# Patient Record
Sex: Male | Born: 1970
Health system: Southern US, Community
[De-identification: ages and names within clinical notes are randomized; demographics above are authoritative.]

## PROBLEM LIST (undated history)

## (undated) DIAGNOSIS — Z973 Presence of spectacles and contact lenses: Secondary | ICD-10-CM

## (undated) DIAGNOSIS — Z87442 Personal history of urinary calculi: Secondary | ICD-10-CM

## (undated) DIAGNOSIS — F4001 Agoraphobia with panic disorder: Secondary | ICD-10-CM

## (undated) DIAGNOSIS — I340 Nonrheumatic mitral (valve) insufficiency: Secondary | ICD-10-CM

## (undated) DIAGNOSIS — F32A Depression, unspecified: Secondary | ICD-10-CM

## (undated) DIAGNOSIS — M1990 Unspecified osteoarthritis, unspecified site: Secondary | ICD-10-CM

## (undated) DIAGNOSIS — K449 Diaphragmatic hernia without obstruction or gangrene: Secondary | ICD-10-CM

## (undated) DIAGNOSIS — N200 Calculus of kidney: Secondary | ICD-10-CM

## (undated) DIAGNOSIS — F411 Generalized anxiety disorder: Secondary | ICD-10-CM

## (undated) DIAGNOSIS — F329 Major depressive disorder, single episode, unspecified: Secondary | ICD-10-CM

## (undated) DIAGNOSIS — K219 Gastro-esophageal reflux disease without esophagitis: Secondary | ICD-10-CM

## (undated) DIAGNOSIS — G43909 Migraine, unspecified, not intractable, without status migrainosus: Secondary | ICD-10-CM

## (undated) DIAGNOSIS — G4730 Sleep apnea, unspecified: Secondary | ICD-10-CM

## (undated) DIAGNOSIS — K5792 Diverticulitis of intestine, part unspecified, without perforation or abscess without bleeding: Secondary | ICD-10-CM

## (undated) DIAGNOSIS — I10 Essential (primary) hypertension: Secondary | ICD-10-CM

## (undated) DIAGNOSIS — F419 Anxiety disorder, unspecified: Secondary | ICD-10-CM

## (undated) HISTORY — PX: COLON SURGERY: SHX602

## (undated) HISTORY — DX: Anxiety disorder, unspecified: F41.9

## (undated) HISTORY — PX: NASAL SINUS SURGERY: SHX719

## (undated) HISTORY — DX: Depression, unspecified: F32.A

## (undated) HISTORY — DX: Major depressive disorder, single episode, unspecified: F32.9

---

## 2011-12-19 ENCOUNTER — Ambulatory Visit: Payer: Self-pay | Admitting: Internal Medicine

## 2013-08-06 ENCOUNTER — Ambulatory Visit: Payer: Self-pay | Admitting: Physician Assistant

## 2013-11-01 ENCOUNTER — Ambulatory Visit: Payer: Self-pay | Admitting: Emergency Medicine

## 2014-08-09 HISTORY — PX: CYSTOSCOPY/RETROGRADE/URETEROSCOPY/STONE EXTRACTION WITH BASKET: SHX5317

## 2014-10-01 ENCOUNTER — Ambulatory Visit: Payer: Self-pay | Admitting: Family Medicine

## 2014-10-06 ENCOUNTER — Ambulatory Visit: Payer: Self-pay | Admitting: Family Medicine

## 2015-12-30 DIAGNOSIS — F411 Generalized anxiety disorder: Secondary | ICD-10-CM | POA: Diagnosis not present

## 2015-12-30 DIAGNOSIS — F4001 Agoraphobia with panic disorder: Secondary | ICD-10-CM | POA: Diagnosis not present

## 2016-03-02 DIAGNOSIS — F4001 Agoraphobia with panic disorder: Secondary | ICD-10-CM | POA: Diagnosis not present

## 2016-03-02 DIAGNOSIS — F411 Generalized anxiety disorder: Secondary | ICD-10-CM | POA: Diagnosis not present

## 2016-05-20 ENCOUNTER — Ambulatory Visit
Admission: EM | Admit: 2016-05-20 | Discharge: 2016-05-20 | Disposition: A | Discharge status: Home / Self Care | Payer: PPO | Attending: Emergency Medicine | Admitting: Emergency Medicine

## 2016-05-20 ENCOUNTER — Encounter: Payer: Self-pay | Admitting: Emergency Medicine

## 2016-05-20 DIAGNOSIS — R05 Cough: Secondary | ICD-10-CM

## 2016-05-20 DIAGNOSIS — R059 Cough, unspecified: Secondary | ICD-10-CM

## 2016-05-20 DIAGNOSIS — J0140 Acute pansinusitis, unspecified: Secondary | ICD-10-CM

## 2016-05-20 HISTORY — DX: Gastro-esophageal reflux disease without esophagitis: K21.9

## 2016-05-20 HISTORY — DX: Essential (primary) hypertension: I10

## 2016-05-20 MED ORDER — ALBUTEROL SULFATE HFA 108 (90 BASE) MCG/ACT IN AERS: 1.0000 | INHALATION_SPRAY | Freq: Four times a day (QID) | RESPIRATORY_TRACT | 0 refills | Status: DC | PRN

## 2016-05-20 MED ORDER — HYDROCOD POLST-CPM POLST ER 10-8 MG/5ML PO SUER
5.0000 mL | Freq: Two times a day (BID) | ORAL | 0 refills | Status: DC | PRN
Start: 2016-05-20 — End: 2016-12-14

## 2016-05-20 MED ORDER — DOXYCYCLINE HYCLATE 100 MG PO CAPS: 100.0000 mg | ORAL_CAPSULE | Freq: Two times a day (BID) | ORAL | 0 refills | Status: DC

## 2016-05-20 MED ORDER — MOMETASONE FUROATE 50 MCG/ACT NA SUSP: 2.0000 | Freq: Every day | NASAL | 0 refills | Status: DC

## 2016-05-20 MED ORDER — BENZONATATE 200 MG PO CAPS: 200.0000 mg | ORAL_CAPSULE | Freq: Three times a day (TID) | ORAL | 0 refills | Status: DC | PRN

## 2016-05-20 MED ORDER — AEROCHAMBER PLUS MISC: 2 refills | Status: DC

## 2016-05-20 NOTE — ED Provider Notes (Signed)
HPI  SUBJECTIVE:  Jonathan Aguirre is a 45 y.o. male who presents with 2 complaints. First, he reports a nonproductive cough for the past 4 weeks. He reports chest soreness from the cough. States that overall is getting worse and that he is coughing until he has headaches and it makes him dizzy. Symptoms are worse with activity worse at night and first thing in the morning, no alleviating factors. He has tried Robitussin for this. He denies any preceding URI, wheezing, chest pain, shortness of breath, hemoptysis. No unintentional weight loss. No fevers. No body aches, fatigue. No GERD-type symptoms although the patient has GERD. States that he takes omeprazole for this and it controls his symptoms. No lower extremity edema, orthopnea, nocturia, PND, dyspnea on exertion, unintentional weight gain or abdominal pain.  Second, he reports nasal congestion, rhinorrhea, postnasal drip, right maxillary and frontal sinus pain and pressure for the past 4 days. States his upper teeth hurt. He states that the right side of his face "feels swollen". He had a sore throat, but it has resolved. Some sneezing, but no itchy or watery eyes. No fevers with this. No ear pain or fullness. No purulent nasal drainage, other headache. No antipyretic in past 6-8 hours. He has been taking Sudafed for this. Symptoms are worse with bending forward. No alleviating factors.  He has a past medical history of sinusitis, and states that this feels identical to previous episodes of sinusitis. Also history of GERD, hypertension for which she takes atenolol. He is not on any Ace inhibitors. Also anxiety and depression. No history of CHF, smoking, diabetes, asthma, emphysema, COPD. PMD: [facility]. He has an appointment with them next week.    Past Medical History:  Diagnosis Date  . GERD (gastroesophageal reflux disease)   . Hypertension     Past Surgical History:  Procedure Laterality Date  . COLON SURGERY       History reviewed. No pertinent family history.  Social History  Substance Use Topics  . Smoking status: Never Smoker  . Smokeless tobacco: Never Used  . Alcohol use No    No current facility-administered medications for this encounter.   Current Outpatient Prescriptions:  .  atenolol (TENORMIN) 25 MG tablet, Take by mouth daily., Disp: , Rfl:  .  omeprazole (PRILOSEC) 20 MG capsule, Take 20 mg by mouth 2 (two) times daily before a meal., Disp: , Rfl:  .  traZODone (DESYREL) 100 MG tablet, Take 200 mg by mouth at bedtime., Disp: , Rfl:  .  albuterol (PROVENTIL HFA;VENTOLIN HFA) 108 (90 Base) MCG/ACT inhaler, Inhale 1-2 puffs into the lungs every 6 (six) hours as needed for wheezing or shortness of breath., Disp: 1 Inhaler, Rfl: 0 .  benzonatate (TESSALON) 200 MG capsule, Take 1 capsule (200 mg total) by mouth 3 (three) times daily as needed for cough., Disp: 30 capsule, Rfl: 0 .  chlorpheniramine-HYDROcodone (TUSSIONEX PENNKINETIC ER) 10-8 MG/5ML SUER, Take 5 mLs by mouth every 12 (twelve) hours as needed for cough., Disp: 120 mL, Rfl: 0 .  doxycycline (VIBRAMYCIN) 100 MG capsule, Take 1 capsule (100 mg total) by mouth 2 (two) times daily. X 7 days, Disp: 14 capsule, Rfl: 0 .  mometasone (NASONEX) 50 MCG/ACT nasal spray, Place 2 sprays into the nose daily., Disp: 17 g, Rfl: 0 .  Spacer/Aero-Holding Chambers (AEROCHAMBER PLUS) inhaler, Use as instructed, Disp: 1 each, Rfl: 2  No Known Allergies   ROS  As noted in HPI.   Physical Exam  BP 110/86 (BP Location: Left Arm)   Pulse 81   Temp 97.9 F (36.6 C) (Tympanic)   Resp 16   Ht 6\' 1"  (1.854 m)   Wt 270 lb (122.5 kg)   SpO2 98%   BMI 35.62 kg/m   Constitutional: Well developed, well nourished, no acute distress Eyes:  EOMI, conjunctiva normal bilaterally HENT: Normocephalic, atraumatic,mucus membranes moist. TMs normal. Positive purulent nasal drainage. Positive maxillary sinus tenderness right side. Positive postnasal  drip, oropharynx otherwise unremarkable. Respiratory: Normal inspiratory effort lungs clear bilaterally. Good air movement. Positive chest wall tenderness Cardiovascular: Normal rate regular rhythm no murmurs rubs or gallops GI: nondistended skin: No rash, skin intact Musculoskeletal: no deformities. No lower extremity edema bilaterally. Neurologic: Alert & oriented x 3, no focal neuro deficits Psychiatric: Speech and behavior appropriate   ED Course   Medications - No data to display  No orders of the defined types were placed in this encounter.   No results found for this or any previous visit (from the past 24 hour(s)). No results found.  ED Clinical Impression  Cough  Acute non-recurrent pansinusitis   ED Assessment/Plan  Offered to do an x-ray, however, patient declined.  Has follow-up appointment with his primary care physician at [facility] next week. If he is not getting any better despite this treatment, he will revisit this with his physician and may do an x-ray at that time.  Feel the cough could be from postnasal drip versus silent GERD although patient is already on omeprazole and denies any symptoms of GERD. Doubt pneumonia in the absence of fevers, body aches or fatigue, and focal lung findings, malignancy the absence of unintentional weight loss, could be reactive airways. No evidence of CHF. He is not on any Ace inhibitors. Plan to send home with albuterol with spacer, Nasonex and advised him to start an antihistamine such as Claritin, Allegra, Zyrtec when he is feeling better from his sinuses. Also Tussionex and Tessalon.   Patient with a sinusitis, could be viral, however, he has purulent nasal drainage. We'll send him home on doxycycline 100 mg twice a day for 7 days which would also cover an occult pneumonia, Nasonex, saline nasal irrigation, plain Mucinex given that he has a history of high blood pressure. We'll switch to an antihistamine once his  sinuses clear.  Discussed  MDM, plan and followup with patient. Discussed sn/sx that should prompt return to the ED. Patient  agrees with plan.   Meds ordered this encounter  Medications  . atenolol (TENORMIN) 25 MG tablet    Sig: Take by mouth daily.  Marland Kitchen omeprazole (PRILOSEC) 20 MG capsule    Sig: Take 20 mg by mouth 2 (two) times daily before a meal.  . traZODone (DESYREL) 100 MG tablet    Sig: Take 200 mg by mouth at bedtime.  . benzonatate (TESSALON) 200 MG capsule    Sig: Take 1 capsule (200 mg total) by mouth 3 (three) times daily as needed for cough.    Dispense:  30 capsule    Refill:  0  . chlorpheniramine-HYDROcodone (TUSSIONEX PENNKINETIC ER) 10-8 MG/5ML SUER    Sig: Take 5 mLs by mouth every 12 (twelve) hours as needed for cough.    Dispense:  120 mL    Refill:  0  . Spacer/Aero-Holding Chambers (AEROCHAMBER PLUS) inhaler    Sig: Use as instructed    Dispense:  1 each    Refill:  2  . albuterol (PROVENTIL HFA;VENTOLIN HFA)  108 (90 Base) MCG/ACT inhaler    Sig: Inhale 1-2 puffs into the lungs every 6 (six) hours as needed for wheezing or shortness of breath.    Dispense:  1 Inhaler    Refill:  0  . mometasone (NASONEX) 50 MCG/ACT nasal spray    Sig: Place 2 sprays into the nose daily.    Dispense:  17 g    Refill:  0  . doxycycline (VIBRAMYCIN) 100 MG capsule    Sig: Take 1 capsule (100 mg total) by mouth 2 (two) times daily. X 7 days    Dispense:  14 capsule    Refill:  0    *This clinic note was created using [employer]. Therefore, there may be occasional mistakes despite careful proofreading.  ?   Melynda Ripple, MD 05/20/16 1309

## 2016-05-20 NOTE — Discharge Instructions (Signed)
You may take 800 mg of motrin with 1 gram of tylenol up to 3 times a day as needed for pain. This is an effective combination for pain.  Use a neti pot or the NeilMed sinus rinse as often as you want to to reduce nasal congestion. Follow the directions on the box.   Go to www.goodrx.com to look up your medications. This will give you a list of where you can find your prescriptions at the most affordable prices.

## 2016-05-20 NOTE — ED Triage Notes (Signed)
Patient c/o cough, chest congestion and sinus pressure and pain for 4 weeks.  Patient denies fevers.

## 2016-05-25 DIAGNOSIS — I10 Essential (primary) hypertension: Secondary | ICD-10-CM | POA: Diagnosis not present

## 2016-05-25 DIAGNOSIS — Z0000 Encounter for general adult medical examination without abnormal findings: Secondary | ICD-10-CM | POA: Diagnosis not present

## 2016-05-25 DIAGNOSIS — Z23 Encounter for immunization: Secondary | ICD-10-CM | POA: Diagnosis not present

## 2016-05-25 DIAGNOSIS — K219 Gastro-esophageal reflux disease without esophagitis: Secondary | ICD-10-CM | POA: Diagnosis not present

## 2016-07-20 DIAGNOSIS — F4001 Agoraphobia with panic disorder: Secondary | ICD-10-CM | POA: Diagnosis not present

## 2016-07-20 DIAGNOSIS — F411 Generalized anxiety disorder: Secondary | ICD-10-CM | POA: Diagnosis not present

## 2016-11-16 DIAGNOSIS — F4001 Agoraphobia with panic disorder: Secondary | ICD-10-CM | POA: Diagnosis not present

## 2016-11-16 DIAGNOSIS — F411 Generalized anxiety disorder: Secondary | ICD-10-CM | POA: Diagnosis not present

## 2016-12-03 ENCOUNTER — Ambulatory Visit: Payer: PPO | Admitting: Cardiovascular Disease

## 2016-12-11 NOTE — Progress Notes (Addendum)
Cardiology Office Note  Date:  12/14/2016   ID:  ACELIN FERDIG, DOB 1970-12-14, MRN [id number]  PCP:  [facility]   Chief Complaint  Patient presents with  . other    Self referral for a cardiac evaluation. Meds reviewed by the pt. verbally. Pt. c/o chest pain and shortness of breath, dizziness, nausea and breaking out into a sweat.     HPI:  Jonathan Aguirre is a pleasant 46 year old gentleman with history of Obesity Anxiety/depression HTN Back pain H/A GERD Chronic chest pain Who presents by self-referral for evaluation of his chest pain and shortness of breath  Wife presents with him today Reports he had dramatic weight loss several years ago, 50 pounds Since that time has put some of the weight back on He is a farmer, very active at baseline, different types of animals  Currently takes atenolol 25 mg daily Reports having occasional dizziness when he stands up Sometimes has dizziness from a sitting position Reports recently was driving when he had dizziness while driving had to pull over, symptoms lasted less than 1 minute  Long history of chest pain Previous workup including echocardiogram and stress test Wife feels it is likely from stress  Sometimes with shortness of breath, typically my present with short exertion such as walking in his house. 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  PSH:    Past Surgical History:  Procedure Laterality Date  . COLON SURGERY      Current Outpatient Prescriptions  Medication Sig  Dispense Refill  . atenolol (TENORMIN) 25 MG tablet Take by mouth daily.    Marland Kitchen lamoTRIgine (LAMICTAL) 100 MG tablet Take 100 mg by mouth daily.    Marland Kitchen omeprazole (PRILOSEC) 20 MG capsule Take 20 mg by mouth 2 (two) times daily before a meal.    . traZODone (DESYREL) 100 MG tablet Take 200 mg by mouth at bedtime.     No current facility-administered medications for this visit.      Allergies:   Patient has no known allergies.   Social History:  The patient  reports that he has never smoked. He has never used smokeless tobacco. He reports that he does not drink alcohol or use drugs.   Family History:   family history includes Heart failure in his mother; Hyperlipidemia in his mother; Hypertension in his mother.    Review of Systems: Review of Systems  Constitutional: Negative.        Weight gain  Respiratory: Positive for shortness of breath.   Cardiovascular: Positive for chest pain.  Gastrointestinal: Negative.   Musculoskeletal: Negative.   Neurological: Positive for dizziness.  Psychiatric/Behavioral: The patient is nervous/anxious.   All other systems reviewed and are negative.    PHYSICAL EXAM: VS:  BP 122/90 (BP Location: Right Arm, Patient Position: Sitting, Cuff Size: Large)   Pulse 86   Ht 6\' 1"  (1.854 m)   Wt 279 lb 12 oz (126.9 kg)   BMI 36.91 kg/m  , BMI Body mass index is 36.91 kg/m. GEN: Well nourished, well  developed, in no acute distress , Obese HEENT: normal  Neck: no JVD, carotid bruits, or masses Cardiac: RRR; no murmurs, rubs, or gallops,no edema  Respiratory:  clear to auscultation bilaterally, normal work of breathing GI: soft, nontender, nondistended, + BS MS: no deformity or atrophy  Skin: warm and dry, no rash Neuro:  Strength and sensation are intact Psych: euthymic mood, full affect    Recent Labs: No results found for requested labs within last 8760 hours.    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  If score is elevated, would treat risk factors more aggressively, possibly including additional workup such as stress testing  Hypertension, unspecified type - Plan: EKG 12-Lead, CT CARDIAC SCORING Blood pressure is well controlled on today's visit. No changes made to the medications.  Family history of premature CAD - Plan: CT CARDIAC SCORING Given strong family history, we have recommended CT coronary calcium scoring for risk stratification  Shortness of breath Shortness of breath likely secondary to deconditioning and weight Recommended regular exercise program, weight loss  Morbid obesity ([place]) We have encouraged continued exercise, careful diet management in an effort to lose weight.  Anxiety Long-standing issue, reports symptoms are relatively well-controlled  Dizziness Etiology unclear, unable to exclude orthostasis. Recommended he stay hydrated Unable to exclude arrhythmia and recommended he use a pulse meter on his phone to track his rhythm. If there is concern of arrhythmia, event monitor could be ordered  Disposition:   F/U  as needed   Total encounter time more than 45 minutes  Greater than 50% was spent in counseling and coordination of care with the patient    Orders Placed This Encounter  Procedures  . CT CARDIAC SCORING  . EKG 12-Lead     Signed, Esmond Plants, M.D., Ph.D. 12/14/2016  [facility] [place], [place] [phone]

## 2016-12-14 ENCOUNTER — Ambulatory Visit (INDEPENDENT_AMBULATORY_CARE_PROVIDER_SITE_OTHER): Payer: PPO | Admitting: Cardiovascular Disease

## 2016-12-14 ENCOUNTER — Encounter: Payer: Self-pay | Admitting: Cardiovascular Disease

## 2016-12-14 VITALS — BP 122/90 | HR 86 | Ht 73.0 in | Wt 279.8 lb

## 2016-12-14 DIAGNOSIS — I10 Essential (primary) hypertension: Secondary | ICD-10-CM | POA: Insufficient documentation

## 2016-12-14 DIAGNOSIS — R079 Chest pain, unspecified: Secondary | ICD-10-CM | POA: Diagnosis not present

## 2016-12-14 DIAGNOSIS — F419 Anxiety disorder, unspecified: Secondary | ICD-10-CM

## 2016-12-14 DIAGNOSIS — R42 Dizziness and giddiness: Secondary | ICD-10-CM | POA: Diagnosis not present

## 2016-12-14 DIAGNOSIS — Z8249 Family history of ischemic heart disease and other diseases of the circulatory system: Secondary | ICD-10-CM | POA: Diagnosis not present

## 2016-12-14 DIAGNOSIS — R0602 Shortness of breath: Secondary | ICD-10-CM

## 2016-12-14 NOTE — Patient Instructions (Addendum)
Medication Instructions:   No medication changes made  Labwork:  No new labs needed  Testing/Procedures:  We will order a CT coronary calcium score  Please call [phone] to schedule this at your convenience There is a one-time fee of $150 due at the time of your procedure [address] [place]: as  needed  If you need a refill on your cardiac medications before your next appointment, please call your pharmacy.    Coronary Calcium Scan A coronary calcium scan is an imaging test used to look for deposits of calcium and other fatty materials (plaques) in the inner lining of the blood vessels of the heart (coronary arteries). These deposits of calcium and plaques can partly clog and narrow the coronary arteries without producing any symptoms or warning signs. This puts a person at risk for a heart attack. This test can detect these deposits before symptoms develop. Tell a health care provider about:  Any allergies you have.  All medicines you are taking, including vitamins, herbs, eye drops, creams, and over-the-counter medicines.  Any problems you or family members have had with anesthetic medicines.  Any blood disorders you have.  Any surgeries you have had.  Any medical conditions you have.  Whether you are pregnant or may be pregnant. What are the risks? Generally, this is a safe procedure. However, problems may occur, including:  Harm to a pregnant woman and her unborn baby. This test involves the use of radiation. Radiation exposure can be dangerous to a pregnant woman and her unborn baby. If you are pregnant, you generally should not have this procedure done.  Slight increase in the risk of cancer. This is because of the radiation involved in the test. What happens before the procedure? No preparation is needed for this procedure. What happens during the procedure?  You will undress and remove any jewelry around your neck or  chest.  You will put on a hospital gown.  Sticky electrodes will be placed on your chest. The electrodes will be connected to an electrocardiogram (ECG) machine to record a tracing of the electrical activity of your heart.  A CT scanner will take pictures of your heart. During this time, you will be asked to lie still and hold your breath for 2-3 seconds while a picture of your heart is being taken. The procedure may vary among health care providers and hospitals. What happens after the procedure?  You can get dressed.  You can return to your normal activities.  It is up to you to get the results of your test. Ask your health care provider, or the department that is doing the test, when your results will be ready. Summary  A coronary calcium scan is an imaging test used to look for deposits of calcium and other fatty materials (plaques) in the inner lining of the blood vessels of the heart (coronary arteries).  Generally, this is a safe procedure. Tell your health care provider if you are pregnant or may be pregnant.  No preparation is needed for this procedure.  A CT scanner will take pictures of your heart.  You can return to your normal activities after the scan is done. This information is not intended to replace advice given to you by your health care provider. Make sure you discuss any questions you have with your health care provider. Document Released: 01/22/2008 Document Revised: 06/14/2016 Document Reviewed: 06/14/2016 Elsevier Interactive Patient Education  2017 [employer].

## 2017-01-26 ENCOUNTER — Ambulatory Visit (INDEPENDENT_AMBULATORY_CARE_PROVIDER_SITE_OTHER)
Admission: RE | Admit: 2017-01-26 | Discharge: 2017-01-26 | Disposition: A | Discharge status: Home / Self Care | Payer: Self-pay | Source: Ambulatory Visit | Attending: Cardiovascular Disease | Admitting: Cardiovascular Disease

## 2017-01-26 DIAGNOSIS — I10 Essential (primary) hypertension: Secondary | ICD-10-CM

## 2017-01-26 DIAGNOSIS — R079 Chest pain, unspecified: Secondary | ICD-10-CM

## 2017-01-26 DIAGNOSIS — Z8249 Family history of ischemic heart disease and other diseases of the circulatory system: Secondary | ICD-10-CM

## 2017-01-31 ENCOUNTER — Other Ambulatory Visit: Payer: Self-pay | Admitting: *Deleted

## 2017-01-31 DIAGNOSIS — R0602 Shortness of breath: Secondary | ICD-10-CM

## 2017-01-31 MED ORDER — ROSUVASTATIN CALCIUM 5 MG PO TABS: 5.0000 mg | ORAL_TABLET | Freq: Every day | ORAL | 3 refills | Status: DC

## 2017-02-07 ENCOUNTER — Other Ambulatory Visit: Payer: Self-pay

## 2017-02-07 DIAGNOSIS — I10 Essential (primary) hypertension: Secondary | ICD-10-CM

## 2017-02-07 DIAGNOSIS — Z8249 Family history of ischemic heart disease and other diseases of the circulatory system: Secondary | ICD-10-CM

## 2017-02-07 DIAGNOSIS — R0602 Shortness of breath: Secondary | ICD-10-CM

## 2017-02-07 DIAGNOSIS — R079 Chest pain, unspecified: Secondary | ICD-10-CM

## 2017-03-01 ENCOUNTER — Other Ambulatory Visit (INDEPENDENT_AMBULATORY_CARE_PROVIDER_SITE_OTHER): Payer: PPO | Admitting: *Deleted

## 2017-03-01 ENCOUNTER — Other Ambulatory Visit: Payer: Self-pay

## 2017-03-01 ENCOUNTER — Ambulatory Visit (INDEPENDENT_AMBULATORY_CARE_PROVIDER_SITE_OTHER): Payer: PPO

## 2017-03-01 DIAGNOSIS — R0602 Shortness of breath: Secondary | ICD-10-CM

## 2017-03-01 DIAGNOSIS — Z8249 Family history of ischemic heart disease and other diseases of the circulatory system: Secondary | ICD-10-CM | POA: Diagnosis not present

## 2017-03-01 DIAGNOSIS — R079 Chest pain, unspecified: Secondary | ICD-10-CM

## 2017-03-01 DIAGNOSIS — I10 Essential (primary) hypertension: Secondary | ICD-10-CM

## 2017-03-02 LAB — LIPID PANEL
Chol/HDL Ratio: 3.7 ratio (ref 0.0–5.0)
Cholesterol, Total: 138 mg/dL (ref 100–199)
HDL: 37 mg/dL — AB (ref >39)
LDL Calculated: 77 mg/dL (ref 0–99)
Triglycerides: 121 mg/dL (ref 0–149)
VLDL CHOLESTEROL CAL: 24 mg/dL (ref 5–40)

## 2017-03-02 LAB — HEPATIC FUNCTION PANEL
ALT: 39 IU/L (ref 0–44)
AST: 30 IU/L (ref 0–40)
Albumin: 4.5 g/dL (ref 3.5–5.5)
Alkaline Phosphatase: 50 IU/L (ref 39–117)
Bilirubin Total: 0.6 mg/dL (ref 0.0–1.2)
Bilirubin, Direct: 0.16 mg/dL (ref 0.00–0.40)
Total Protein: 6.7 g/dL (ref 6.0–8.5)

## 2017-03-02 LAB — HEMOGLOBIN A1C
Est. average glucose Bld gHb Est-mCnc: 100 mg/dL
Hgb A1c MFr Bld: 5.1 % (ref 4.8–5.6)

## 2017-03-29 DIAGNOSIS — L918 Other hypertrophic disorders of the skin: Secondary | ICD-10-CM | POA: Diagnosis not present

## 2017-03-29 DIAGNOSIS — L821 Other seborrheic keratosis: Secondary | ICD-10-CM | POA: Diagnosis not present

## 2017-03-29 DIAGNOSIS — D229 Melanocytic nevi, unspecified: Secondary | ICD-10-CM | POA: Diagnosis not present

## 2017-05-24 ENCOUNTER — Emergency Department
Admission: EM | Admit: 2017-05-24 | Discharge: 2017-05-24 | Disposition: A | Discharge status: Home / Self Care | Payer: PPO | Attending: Emergency Medicine | Admitting: Emergency Medicine

## 2017-05-24 ENCOUNTER — Encounter: Payer: Self-pay | Admitting: Emergency Medicine

## 2017-05-24 ENCOUNTER — Emergency Department: Payer: PPO

## 2017-05-24 DIAGNOSIS — Z79899 Other long term (current) drug therapy: Secondary | ICD-10-CM | POA: Diagnosis not present

## 2017-05-24 DIAGNOSIS — N451 Epididymitis: Secondary | ICD-10-CM | POA: Diagnosis not present

## 2017-05-24 DIAGNOSIS — I10 Essential (primary) hypertension: Secondary | ICD-10-CM | POA: Diagnosis not present

## 2017-05-24 DIAGNOSIS — I861 Scrotal varices: Secondary | ICD-10-CM | POA: Insufficient documentation

## 2017-05-24 DIAGNOSIS — N5089 Other specified disorders of the male genital organs: Secondary | ICD-10-CM

## 2017-05-24 DIAGNOSIS — N509 Disorder of male genital organs, unspecified: Secondary | ICD-10-CM | POA: Diagnosis not present

## 2017-05-24 DIAGNOSIS — N50811 Right testicular pain: Secondary | ICD-10-CM | POA: Diagnosis present

## 2017-05-24 LAB — COMPREHENSIVE METABOLIC PANEL
ALT: 25 U/L (ref 17–63)
ANION GAP: 8 (ref 5–15)
AST: 23 U/L (ref 15–41)
Albumin: 4.4 g/dL (ref 3.5–5.0)
Alkaline Phosphatase: 45 U/L (ref 38–126)
BUN: 14 mg/dL (ref 6–20)
CHLORIDE: 108 mmol/L (ref 101–111)
CO2: 22 mmol/L (ref 22–32)
Calcium: 9.2 mg/dL (ref 8.9–10.3)
Creatinine, Ser: 0.92 mg/dL (ref 0.61–1.24)
GFR calc non Af Amer: >60 mL/min (ref >60)
Glucose, Bld: 104 mg/dL — ABNORMAL HIGH (ref 65–99)
Potassium: 4.2 mmol/L (ref 3.5–5.1)
SODIUM: 138 mmol/L (ref 135–145)
Total Bilirubin: 1 mg/dL (ref 0.3–1.2)
Total Protein: 7.1 g/dL (ref 6.5–8.1)

## 2017-05-24 LAB — URINALYSIS, COMPLETE (UACMP) WITH MICROSCOPIC
BILIRUBIN URINE: NEGATIVE
Bacteria, UA: NONE SEEN
GLUCOSE, UA: NEGATIVE mg/dL
Hgb urine dipstick: NEGATIVE
KETONES UR: NEGATIVE mg/dL
LEUKOCYTES UA: NEGATIVE
Nitrite: NEGATIVE
PH: 6 (ref 5.0–8.0)
Protein, ur: NEGATIVE mg/dL
RBC / HPF: NONE SEEN RBC/hpf (ref 0–5)
Specific Gravity, Urine: 1.025 (ref 1.005–1.030)

## 2017-05-24 LAB — CBC
HCT: 45.8 % (ref 40.0–52.0)
HEMOGLOBIN: 15.5 g/dL (ref 13.0–18.0)
MCH: 29.7 pg (ref 26.0–34.0)
MCHC: 33.9 g/dL (ref 32.0–36.0)
MCV: 87.5 fL (ref 80.0–100.0)
Platelets: 246 10*3/uL (ref 150–440)
RBC: 5.23 MIL/uL (ref 4.40–5.90)
RDW: 13.4 % (ref 11.5–14.5)
WBC: 11.3 10*3/uL — ABNORMAL HIGH (ref 3.8–10.6)

## 2017-05-24 LAB — CHLAMYDIA/NGC RT PCR (ARMC ONLY)
Chlamydia Tr: NOT DETECTED
N gonorrhoeae: NOT DETECTED

## 2017-05-24 LAB — LIPASE, BLOOD: LIPASE: 32 U/L (ref 11–51)

## 2017-05-24 MED ORDER — SODIUM CHLORIDE 0.9 % IV BOLUS (SEPSIS)
1000.0000 mL | Freq: Once | INTRAVENOUS | Status: AC
  Administered 2017-05-24: 1000 mL via INTRAVENOUS

## 2017-05-24 MED ORDER — KETOROLAC TROMETHAMINE 30 MG/ML IJ SOLN
15.0000 mg | Freq: Once | INTRAMUSCULAR | Status: AC
  Administered 2017-05-24: 15 mg via INTRAVENOUS
  Filled 2017-05-24: qty 1

## 2017-05-24 MED ORDER — MORPHINE SULFATE (PF) 4 MG/ML IV SOLN
4.0000 mg | Freq: Once | INTRAVENOUS | Status: AC
  Administered 2017-05-24: 4 mg via INTRAVENOUS
  Filled 2017-05-24: qty 1

## 2017-05-24 MED ORDER — IBUPROFEN 600 MG PO TABS: 600.0000 mg | ORAL_TABLET | Freq: Four times a day (QID) | ORAL | 0 refills | Status: DC | PRN

## 2017-05-24 MED ORDER — ONDANSETRON HCL 4 MG/2ML IJ SOLN
4.0000 mg | Freq: Once | INTRAMUSCULAR | Status: AC
  Administered 2017-05-24: 4 mg via INTRAVENOUS
  Filled 2017-05-24: qty 2

## 2017-05-24 MED ORDER — OXYCODONE-ACETAMINOPHEN 5-325 MG PO TABS: 1.0000 | ORAL_TABLET | Freq: Four times a day (QID) | ORAL | 0 refills | Status: DC | PRN

## 2017-05-24 MED ORDER — SULFAMETHOXAZOLE-TRIMETHOPRIM 800-160 MG PO TABS: 1.0000 | ORAL_TABLET | Freq: Two times a day (BID) | ORAL | 0 refills | Status: AC

## 2017-05-24 MED ORDER — CEFTRIAXONE SODIUM IN DEXTROSE 20 MG/ML IV SOLN
1.0000 g | Freq: Once | INTRAVENOUS | Status: AC
  Administered 2017-05-24: 1 g via INTRAVENOUS

## 2017-05-24 MED ORDER — SULFAMETHOXAZOLE-TRIMETHOPRIM 800-160 MG PO TABS: 1.0000 | ORAL_TABLET | Freq: Once | ORAL | Status: DC

## 2017-05-24 NOTE — ED Triage Notes (Signed)
Patient ambulatory to triage with steady gait, without difficulty, appears uncomfortable; reports right testicular pain/swelling since yesterday; denies hx of same

## 2017-05-24 NOTE — Discharge Instructions (Signed)
return to the emergency room if your pain gets worse as this could be due to torsion which is a medical emergency. Otherwise follow up with urology in 2-3 days. Take the antibiotics as prescribed. Take ibuprofen 3 times a day with meals or snack and take Percocet as needed for worsening pain.

## 2017-05-24 NOTE — ED Provider Notes (Signed)
[facility] Emergency Department Provider Note  ____________________________________________  Time seen: Approximately 7:49 AM  I have reviewed the triage vital signs and the nursing notes.   HISTORY  Chief Complaint Testicle Pain   HPI Jonathan Aguirre is a 46 y.o. male with no significant past medical history who presents for evaluation of right testicular pain. Patient reports mild discomfort yesterday evening. This morning at 4 AM he woke up with severe pain in his right testicle that has been constant and nonradiating. Patient denies any penile discharge or prior history of STDs. no abdominal pain, nausea, vomiting, fever, chills, dysuria.  Past Medical History:  Diagnosis Date  . Anxiety and depression   . GERD (gastroesophageal reflux disease)   . Hypertension     Patient Active Problem List   Diagnosis Date Noted  . Shortness of breath 12/14/2016  . Hypertension 12/14/2016  . Chest pain 12/14/2016  . Family history of premature CAD 12/14/2016  . Morbid obesity ([place]) 12/14/2016  . Anxiety 12/14/2016  . Dizziness 12/14/2016    Past Surgical History:  Procedure Laterality Date  . COLON SURGERY      Prior to Admission medications   Medication Sig Start Date End Date Taking? Authorizing Provider  atenolol (TENORMIN) 25 MG tablet Take by mouth daily.   Yes [provider]  diphenhydrAMINE (SOMINEX) 25 MG tablet Take 50 mg by mouth at bedtime.   Yes [provider]  lamoTRIgine (LAMICTAL) 100 MG tablet Take 100 mg by mouth daily.   Yes [provider]  Multiple Vitamin (MULTIVITAMIN) tablet Take 1 tablet by mouth daily.   Yes [provider]  omeprazole (PRILOSEC) 20 MG capsule Take 20 mg by mouth 2 (two) times daily before a meal.   Yes [provider]  traZODone (DESYREL) 100 MG tablet Take 200 mg by mouth at bedtime.   Yes [provider]  cyclobenzaprine (FLEXERIL) 10 MG tablet  Take 10 mg by mouth daily as needed for muscle spasms.    [provider]  ibuprofen (ADVIL,MOTRIN) 600 MG tablet Take 1 tablet (600 mg total) by mouth every 6 (six) hours as needed. 05/24/17   Rudene Re, MD  oxyCODONE-acetaminophen (ROXICET) 5-325 MG tablet Take 1 tablet by mouth every 6 (six) hours as needed. 05/24/17 05/24/18  Rudene Re, MD  rosuvastatin (CRESTOR) 5 MG tablet Take 1 tablet (5 mg total) by mouth daily. 01/31/17 05/01/17  Minna Merritts, MD  sulfamethoxazole-trimethoprim (BACTRIM DS,SEPTRA DS) 800-160 MG tablet Take 1 tablet by mouth 2 (two) times daily. 05/24/17 06/03/17  Rudene Re, MD    Allergies Patient has no known allergies.  Family History  Problem Relation Age of Onset  . Heart failure Mother   . Hyperlipidemia Mother   . Hypertension Mother     Social History Social History  Substance Use Topics  . Smoking status: Never Smoker  . Smokeless tobacco: Never Used  . Alcohol use No    Review of Systems  Constitutional: Negative for fever. Eyes: Negative for visual changes. ENT: Negative for sore throat. Neck: No neck pain  Cardiovascular: Negative for chest pain. Respiratory: Negative for shortness of breath. Gastrointestinal: Negative for abdominal pain, vomiting or diarrhea. Genitourinary: Negative for dysuria. + R testicular pain Musculoskeletal: Negative for back pain. Skin: Negative for rash. Neurological: Negative for headaches, weakness or numbness. Psych: No SI or HI  ____________________________________________   PHYSICAL EXAM:  VITAL SIGNS: ED Triage Vitals  Enc Vitals Group  BP 05/24/17 0640 (!) 153/119     Pulse Rate 05/24/17 0640 (!) 106     Resp 05/24/17 0640 20     Temp 05/24/17 0640 98.4 F (36.9 C)     Temp Source 05/24/17 0640 Oral     SpO2 05/24/17 0640 96 %     Weight 05/24/17 0636 275 lb (124.7 kg)     Height 05/24/17 0636 6\' 1"  (1.854 m)     Head Circumference --      Peak  Flow --      Pain Score 05/24/17 0636 8     Pain Loc --      Pain Edu? --      Excl. in [place]? --     Constitutional: Alert and oriented, appears uncomfortable but no distress.  HEENT:      Head: Normocephalic and atraumatic.         Eyes: Conjunctivae are normal. Sclera is non-icteric.       Mouth/Throat: Mucous membranes are moist.       Neck: Supple with no signs of meningismus. Cardiovascular: Regular rate and rhythm. No murmurs, gallops, or rubs. 2+ symmetrical distal pulses are present in all extremities. No JVD. Respiratory: Normal respiratory effort. Lungs are clear to auscultation bilaterally. No wheezes, crackles, or rhonchi.  Gastrointestinal: Soft, non tender, and non distended with positive bowel sounds. No rebound or guarding. Genitourinary: No CVA tenderness. R testicle is swollen and diffusely tender to palpation, testicle feels firm on the R and R scrotum feels fuller than L, unable to obtain bilateral cremasteric reflex. Normal L testicle with no swelling or ttp. No evidence of inguinal hernia.  Musculoskeletal: Nontender with normal range of motion in all extremities. No edema, cyanosis, or erythema of extremities. Neurologic: Normal speech and language. Face is symmetric. Moving all extremities. No gross focal neurologic deficits are appreciated. Skin: Skin is warm, dry and intact. No rash noted. Psychiatric: Mood and affect are normal. Speech and behavior are normal.  ____________________________________________   LABS (all labs ordered are listed, but only abnormal results are displayed)  Labs Reviewed  URINALYSIS, COMPLETE (UACMP) WITH MICROSCOPIC - Abnormal; Notable for the following:       Result Value   Color, Urine YELLOW (*)    APPearance CLEAR (*)    Squamous Epithelial / LPF 0-5 (*)    All other components within normal limits  COMPREHENSIVE METABOLIC PANEL - Abnormal; Notable for the following:    Glucose, Bld 104 (*)    All other components within  normal limits  CBC - Abnormal; Notable for the following:    WBC 11.3 (*)    All other components within normal limits  CHLAMYDIA/NGC RT PCR ([facility] ONLY)  URINE CULTURE  LIPASE, BLOOD   ____________________________________________  EKG  none  ____________________________________________  RADIOLOGY  US scrotum:  1. Right epididymitis. 2. Right varicocele. ____________________________________________   PROCEDURES  Procedure(s) performed: None Procedures Critical Care performed:  None ____________________________________________   INITIAL IMPRESSION / ASSESSMENT AND PLAN / ED COURSE   46 y.o. male with no significant past medical history who presents for evaluation of right testicular pain since last night. R testicle is swollen and diffusely tender to palpation, R scrotum is fuller than left, unable tot obtain bilateral cremasteric reflexes. US done and result pending to eval for torsion. Ddx torsion vs orchitis vs epididymitis vs STD     _________________________ 10:25 AM on 05/24/2017 -----------------------------------------  Discussed with dr. Su Grand and requested a bedside evaluation due  to testicular fullness and significant swelling on my exam. He evaluated imaging studies and told me exam is consistent with findings on [place] and my exam and therefore no need for emergent evaluation in the ED. Recommended close follow up in the office. I discussed signs and symptoms of testicular torsion with the patient and recommended that he returns to the emergency room if these develop. Also explained that this is an emergency if develop. Patient will return if these symptoms develop. Otherwise is to follow-up with urology. He received a dose of Rocephin and is to be discharged home on Bactrim, anti-inflammatories, and Percocet. Recommended icing and close tight underwear   As part of my medical decision making, I reviewed the following data within the electronic medical  record:  History obtained from family, Nursing notes reviewed and incorporated, Labs reviewed , Radiograph reviewed , A consult was requested and obtained from this/these consultant(s) Urology, Notes from prior ED visits and [place] Controlled Substance Database    Pertinent labs & imaging results that were available during my care of the patient were reviewed by me and considered in my medical decision making (see chart for details).    ____________________________________________   FINAL CLINICAL IMPRESSION(S) / ED DIAGNOSES  Final diagnoses:  Right epididymitis  Right varicocele      NEW MEDICATIONS STARTED DURING THIS VISIT:  New Prescriptions   IBUPROFEN (ADVIL,MOTRIN) 600 MG TABLET    Take 1 tablet (600 mg total) by mouth every 6 (six) hours as needed.   OXYCODONE-ACETAMINOPHEN (ROXICET) 5-325 MG TABLET    Take 1 tablet by mouth every 6 (six) hours as needed.   SULFAMETHOXAZOLE-TRIMETHOPRIM (BACTRIM DS,SEPTRA DS) 800-160 MG TABLET    Take 1 tablet by mouth 2 (two) times daily.     Note:  This document was prepared using Dragon voice recognition software and may include unintentional dictation errors.    Rudene Re, MD 05/24/17 1028

## 2017-05-25 LAB — URINE CULTURE: Culture: NO GROWTH

## 2017-05-26 ENCOUNTER — Emergency Department
Admission: EM | Admit: 2017-05-26 | Discharge: 2017-05-26 | Disposition: A | Discharge status: Home / Self Care | Payer: PPO | Attending: Emergency Medicine | Admitting: Emergency Medicine

## 2017-05-26 ENCOUNTER — Encounter: Payer: Self-pay | Admitting: Emergency Medicine

## 2017-05-26 ENCOUNTER — Emergency Department: Payer: PPO

## 2017-05-26 DIAGNOSIS — N453 Epididymo-orchitis: Secondary | ICD-10-CM | POA: Insufficient documentation

## 2017-05-26 DIAGNOSIS — N452 Orchitis: Secondary | ICD-10-CM | POA: Diagnosis not present

## 2017-05-26 DIAGNOSIS — N50812 Left testicular pain: Secondary | ICD-10-CM | POA: Diagnosis not present

## 2017-05-26 DIAGNOSIS — N433 Hydrocele, unspecified: Secondary | ICD-10-CM | POA: Diagnosis not present

## 2017-05-26 DIAGNOSIS — N5089 Other specified disorders of the male genital organs: Secondary | ICD-10-CM | POA: Diagnosis not present

## 2017-05-26 DIAGNOSIS — Z79899 Other long term (current) drug therapy: Secondary | ICD-10-CM | POA: Insufficient documentation

## 2017-05-26 DIAGNOSIS — I10 Essential (primary) hypertension: Secondary | ICD-10-CM | POA: Diagnosis not present

## 2017-05-26 DIAGNOSIS — R52 Pain, unspecified: Secondary | ICD-10-CM

## 2017-05-26 DIAGNOSIS — N50811 Right testicular pain: Secondary | ICD-10-CM | POA: Diagnosis present

## 2017-05-26 LAB — URINALYSIS, COMPLETE (UACMP) WITH MICROSCOPIC
Bacteria, UA: NONE SEEN
Bilirubin Urine: NEGATIVE
GLUCOSE, UA: NEGATIVE mg/dL
Hgb urine dipstick: NEGATIVE
Ketones, ur: NEGATIVE mg/dL
Leukocytes, UA: NEGATIVE
Nitrite: NEGATIVE
PH: 6 (ref 5.0–8.0)
PROTEIN: NEGATIVE mg/dL
RBC / HPF: NONE SEEN RBC/hpf (ref 0–5)
SQUAMOUS EPITHELIAL / LPF: NONE SEEN
Specific Gravity, Urine: 1.018 (ref 1.005–1.030)
WBC, UA: NONE SEEN WBC/hpf (ref 0–5)

## 2017-05-26 LAB — CBC WITH DIFFERENTIAL/PLATELET
Basophils Absolute: 0.1 10*3/uL (ref 0–0.1)
Basophils Relative: 1 %
EOS PCT: 1 %
Eosinophils Absolute: 0.2 10*3/uL (ref 0–0.7)
HCT: 43 % (ref 40.0–52.0)
Hemoglobin: 14.7 g/dL (ref 13.0–18.0)
LYMPHS ABS: 1.5 10*3/uL (ref 1.0–3.6)
LYMPHS PCT: 13 %
MCH: 30 pg (ref 26.0–34.0)
MCHC: 34.1 g/dL (ref 32.0–36.0)
MCV: 88 fL (ref 80.0–100.0)
MONO ABS: 1.1 10*3/uL — AB (ref 0.2–1.0)
Monocytes Relative: 10 %
Neutro Abs: 8.6 10*3/uL — ABNORMAL HIGH (ref 1.4–6.5)
Neutrophils Relative %: 75 %
PLATELETS: 242 10*3/uL (ref 150–440)
RBC: 4.89 MIL/uL (ref 4.40–5.90)
RDW: 13.5 % (ref 11.5–14.5)
WBC: 11.4 10*3/uL — ABNORMAL HIGH (ref 3.8–10.6)

## 2017-05-26 LAB — BASIC METABOLIC PANEL
Anion gap: 10 (ref 5–15)
BUN: 14 mg/dL (ref 6–20)
CALCIUM: 9 mg/dL (ref 8.9–10.3)
CO2: 24 mmol/L (ref 22–32)
CREATININE: 0.91 mg/dL (ref 0.61–1.24)
Chloride: 103 mmol/L (ref 101–111)
GFR calc Af Amer: >60 mL/min (ref >60)
GLUCOSE: 92 mg/dL (ref 65–99)
Potassium: 4.4 mmol/L (ref 3.5–5.1)
Sodium: 137 mmol/L (ref 135–145)

## 2017-05-26 MED ORDER — CIPROFLOXACIN HCL 500 MG PO TABS: 500.0000 mg | ORAL_TABLET | Freq: Two times a day (BID) | ORAL | 0 refills | Status: DC

## 2017-05-26 MED ORDER — KETOROLAC TROMETHAMINE 60 MG/2ML IM SOLN
15.0000 mg | Freq: Once | INTRAMUSCULAR | Status: AC
  Administered 2017-05-26: 15 mg via INTRAMUSCULAR
  Filled 2017-05-26: qty 2

## 2017-05-26 MED ORDER — OXYCODONE-ACETAMINOPHEN 5-325 MG PO TABS
2.0000 | ORAL_TABLET | Freq: Once | ORAL | Status: AC
  Administered 2017-05-26: 2 via ORAL
  Filled 2017-05-26: qty 2

## 2017-05-26 NOTE — ED Triage Notes (Signed)
Pt was seen here Tuesday for scrotal pain and dx with epididymitis and sent home with abx.  Swelling to right testicle worse and redness to area.  Pain now radiating into RLQ

## 2017-05-26 NOTE — ED Provider Notes (Signed)
[facility] Emergency Department Provider Note  ____________________________________________  Time seen: Approximately 12:54 PM  I have reviewed the triage vital signs and the nursing notes.   HISTORY  Chief Complaint Testicle Pain    HPI Jonathan Aguirre is a 46 y.o. male who c/o worsening pain and swelling of the right testicle.  He was seen in the ED 2 days ago, diagnosed with epididymitis, started on Bactrim. He also received ceftriaxone. His  urinalysis and GC chlamydia tests were unremarkable at that time. However, despite taking antibiotics as prescribed, his pain has been constant, worsening gradually. Radiating up into the suprapubic area.  Worse with being upright/walking, no alleviating factors.     Past Medical History:  Diagnosis Date  . Anxiety and depression   . GERD (gastroesophageal reflux disease)   . Hypertension      Patient Active Problem List   Diagnosis Date Noted  . Shortness of breath 12/14/2016  . Hypertension 12/14/2016  . Chest pain 12/14/2016  . Family history of premature CAD 12/14/2016  . Morbid obesity ([place]) 12/14/2016  . Anxiety 12/14/2016  . Dizziness 12/14/2016     Past Surgical History:  Procedure Laterality Date  . COLON SURGERY       Prior to Admission medications   Medication Sig Start Date End Date Taking? Authorizing Provider  atenolol (TENORMIN) 25 MG tablet Take 25 mg by mouth daily.    Yes [provider]  diphenhydrAMINE (SOMINEX) 25 MG tablet Take 50 mg by mouth at bedtime.   Yes [provider]  ibuprofen (ADVIL,MOTRIN) 600 MG tablet Take 1 tablet (600 mg total) by mouth every 6 (six) hours as needed. 05/24/17  Yes Alfred Levins, [place], MD  lamoTRIgine (LAMICTAL) 100 MG tablet Take 100 mg by mouth daily.   Yes [provider]  Multiple Vitamin (MULTIVITAMIN) tablet Take 1 tablet by mouth daily.   Yes [provider]  omeprazole (PRILOSEC) 20 MG capsule Take  20 mg by mouth 2 (two) times daily before a meal.   Yes [provider]  oxyCODONE-acetaminophen (ROXICET) 5-325 MG tablet Take 1 tablet by mouth every 6 (six) hours as needed. 05/24/17 05/24/18 Yes Verones[place], [place], MD  rosuvastatin (CRESTOR) 5 MG tablet Take 1 tablet (5 mg total) by mouth daily. 01/31/17 05/26/17 Yes Gollan, Kathlene November, MD  sulfamethoxazole-trimethoprim (BACTRIM DS,SEPTRA DS) 800-160 MG tablet Take 1 tablet by mouth 2 (two) times daily. 05/24/17 06/03/17 Yes [place], [place], MD  traZODone (DESYREL) 100 MG tablet Take 200 mg by mouth at bedtime.   Yes [provider]  ciprofloxacin (CIPRO) 500 MG tablet Take 1 tablet (500 mg total) by mouth 2 (two) times daily. 05/26/17   Carrie Mew, MD  cyclobenzaprine (FLEXERIL) 10 MG tablet Take 10 mg by mouth daily as needed for muscle spasms.    [provider]     Allergies Patient has no known allergies.   Family History  Problem Relation Age of Onset  . Heart failure Mother   . Hyperlipidemia Mother   . Hypertension Mother     Social History Social History  Substance Use Topics  . Smoking status: Never Smoker  . Smokeless tobacco: Never Used  . Alcohol use No    Review of Systems  Constitutional:   No fever or chills.  ENT:   No sore throat. No rhinorrhea. Cardiovascular:   No chest pain or syncope. Respiratory:   No dyspnea or cough. Gastrointestinal:   Negative for abdominal pain, vomiting and diarrhea.  Musculoskeletal:   Negative for focal pain or swelling All other systems reviewed and are negative except as documented above in ROS and HPI.  ____________________________________________   PHYSICAL EXAM:  VITAL SIGNS: ED Triage Vitals  Enc Vitals Group     BP 05/26/17 0941 (!) 128/102     Pulse Rate 05/26/17 0941 93     Resp 05/26/17 0941 20     Temp 05/26/17 0941 98.6 F (37 C)     Temp Source 05/26/17 0941 Oral     SpO2 05/26/17 0941 96 %     Weight 05/26/17 0952  275 lb (124.7 kg)     Height 05/26/17 0952 6\' 1"  (1.854 m)     Head Circumference --      Peak Flow --      Pain Score 05/26/17 0951 8     Pain Loc --      Pain Edu? --      Excl. in [place]? --     Vital signs reviewed, nursing assessments reviewed.   Constitutional:   Alert and oriented. Well appearing and in no distress. Eyes:   No scleral icterus.  EOMI. [facility]   Head:   Normocephalic and atraumatic.     Mouth/Throat:   MMM, no pharyngeal erythema. No peritonsillar mass.   Hematological/Lymphatic/Immunilogical:   No inguinal lymphadenopathy. Cardiovascular:   RRR. Symmetric bilateral radial and DP pulses.  No murmurs.  Respiratory:   Normal respiratory effort without tachypnea/retractions. Breath sounds are clear and equal bilaterally. No wheezes/rales/rhonchi. Gastrointestinal:   Soft and nontender. Non distended. There is no CVA tenderness.  No rebound, rigidity, or guarding. Genitourinary:   Normal penis. Swollen, red, tender right hemiscrotum. Testicular tenderness. No penile discharge. No fluctuance Musculoskeletal:   Normal range of motion in all extremities. No joint effusions.  No lower extremity tenderness.  No edema. Neurologic:   Normal speech and language.  Motor grossly intact. No gross focal neurologic deficits are appreciated.  Skin:    Skin is warm, dry and intact. No rash noted.  No petechiae, purpura, or bullae.  ____________________________________________    LABS (pertinent positives/negatives) (all labs ordered are listed, but only abnormal results are displayed) Labs Reviewed  CBC WITH DIFFERENTIAL/PLATELET - Abnormal; Notable for the following:       Result Value   WBC 11.4 (*)    Neutro Abs 8.6 (*)    Monocytes Absolute 1.1 (*)    All other components within normal limits  URINALYSIS, COMPLETE (UACMP) WITH MICROSCOPIC - Abnormal; Notable for the following:    Color, Urine YELLOW (*)    APPearance CLEAR (*)    All other components within  normal limits  BASIC METABOLIC PANEL   ____________________________________________   EKG    ____________________________________________    RADIOLOGY  US Scrotum  Result Date: 05/26/2017 CLINICAL DATA:  Swelling of the right aspect of the scrotum increased since May 24, 2017. EXAM: SCROTAL ULTRASOUND DOPPLER ULTRASOUND OF THE TESTICLES TECHNIQUE: Complete ultrasound examination of the testicles, epididymis, and other scrotal structures was performed. Color and spectral Doppler ultrasound were also utilized to evaluate blood flow to the testicles. COMPARISON:  Scrotal ultrasound of May 24, 2017. FINDINGS: Right testicle Measurements: 4.6 x 2.4 x 3.1 cm. No mass or microlithiasis visualized. Left testicle Measurements: 4.5 x 2.5 x 2.7 cm. No mass or microlithiasis visualized. Right epididymis: The right epididymis remains enlarged, exhibits heterogeneous echotexture, and is hypervascular. Left epididymis:  Normal in size and appearance. Hydrocele:  There is a  small left-sided hydrocele. Varicocele:  There is a small right-sided varicocele. Pulsed Doppler interrogation of both testes demonstrates normal low resistance arterial and venous waveforms bilaterally. IMPRESSION: Right epididymitis. No significant change since the previous study. Normal-appearing left epididymis. No evidence of orchitis or testicular torsion. Small left-sided hydrocele and small right-sided varicocele. Electronically Signed   By: Peyson  Martinique M.D.   On: 05/26/2017 10:58   [place] Scrotom Doppler  Result Date: 05/26/2017 CLINICAL DATA:  Swelling of the right aspect of the scrotum increased since May 24, 2017. EXAM: SCROTAL ULTRASOUND DOPPLER ULTRASOUND OF THE TESTICLES TECHNIQUE: Complete ultrasound examination of the testicles, epididymis, and other scrotal structures was performed. Color and spectral Doppler ultrasound were also utilized to evaluate blood flow to the testicles. COMPARISON:  Scrotal ultrasound  of May 24, 2017. FINDINGS: Right testicle Measurements: 4.6 x 2.4 x 3.1 cm. No mass or microlithiasis visualized. Left testicle Measurements: 4.5 x 2.5 x 2.7 cm. No mass or microlithiasis visualized. Right epididymis: The right epididymis remains enlarged, exhibits heterogeneous echotexture, and is hypervascular. Left epididymis:  Normal in size and appearance. Hydrocele:  There is a small left-sided hydrocele. Varicocele:  There is a small right-sided varicocele. Pulsed Doppler interrogation of both testes demonstrates normal low resistance arterial and venous waveforms bilaterally. IMPRESSION: Right epididymitis. No significant change since the previous study. Normal-appearing left epididymis. No evidence of orchitis or testicular torsion. Small left-sided hydrocele and small right-sided varicocele. Electronically Signed   By: Normand  [place] M.D.   On: 05/26/2017 10:58    ____________________________________________   PROCEDURES Procedures  ____________________________________________   DIFFERENTIAL DIAGNOSIS  epididymoorchitis, scrotal abscess, torsion, hernia  CLINICAL IMPRESSION / ASSESSMENT AND PLAN / ED COURSE  Pertinent labs & imaging results that were available during my care of the patient were reviewed by me and considered in my medical decision making (see chart for details).   patient presents with ongoing scrotal pain swelling and redness. ultrasound today is again consistent with epididymitis without complication. We will consult urology given his ongoing worsening symptoms despite being on appropriate antibiotic therapy.  Clinical Course as of May 26 1253  Thu May 26, 2017  1154 Waiting for urology callback given worsening with outpatient management.  [PS]  1201 D/w Dr. Pilar Jarvis, will eval in ED.  Recommends switching abx to Cipro.   [PS]    Clinical Course User Index [PS] Carrie Mew, MD    ----------------------------------------- 1:02 PM on  05/26/2017 -----------------------------------------  Discussed with Dr. Pilar Jarvis after his evaluation. We'll switch the antibiotics to Cipro. Plan to follow up with urology. He advises that these symptoms can continue for a week or more but that there are no other necessary interventions at this time.   ____________________________________________   FINAL CLINICAL IMPRESSION(S) / ED DIAGNOSES    Final diagnoses:  Pain  Swelling of right testicle  Epididymo-orchitis without abscess      New Prescriptions   CIPROFLOXACIN (CIPRO) 500 MG TABLET    Take 1 tablet (500 mg total) by mouth 2 (two) times daily.     Portions of this note were generated with dragon dictation software. Dictation errors may occur despite best attempts at proofreading.    Carrie Mew, MD 05/26/17 [phone]

## 2017-05-26 NOTE — Discharge Instructions (Signed)
Change the antibiotics to Cipro twice a day. Follow up with Dr. Pilar Jarvis as scheduled (they plan to change your appointment from tomorrow to next week since they were able to see you today).

## 2017-05-26 NOTE — Consult Note (Signed)
05/26/2017 12:49 PM   Jonathan Aguirre March 09, 1971 [id number]  Referring provider: Dr. Brenton Grills  Chief Complaint  Patient presents with  . Testicle Pain    HPI: The patient is a 46 year old gentleman with past medical history significant for nephrolithiasis who presents to the hospital for the second time for right epididymitis. He was initially seen 2 days ago and diagnosed with right epididymitis. He was discharged after receiving a dose of Rocephin on doxycycline. He returned today with worsening of his right scrotal pain. The pain is localized to his right hemiscrotum and radiating to his right inguinal area. There is warm to touch. He denies drainage. He denies fevers or chills. He has not had this problem before. He is no history of urinary tract infection. His urine culture from 2 days ago is negative.  Scrotal ultrasound today firmed right epididymal orchitis. There is no sign of abscess.   PMH: Past Medical History:  Diagnosis Date  . Anxiety and depression   . GERD (gastroesophageal reflux disease)   . Hypertension     Surgical History: Past Surgical History:  Procedure Laterality Date  . COLON SURGERY      Allergies: No Known Allergies  Family History: Family History  Problem Relation Age of Onset  . Heart failure Mother   . Hyperlipidemia Mother   . Hypertension Mother     Social History:  reports that he has never smoked. He has never used smokeless tobacco. He reports that he does not drink alcohol or use drugs.  ROS: 12 point ROS negative except  Physical Exam: BP 120/85   Pulse 85   Temp 98.6 F (37 C) (Oral)   Resp 20   Ht 6\' 1"  (1.854 m)   Wt 275 lb (124.7 kg)   SpO2 96%   BMI 36.28 kg/m   Constitutional:  Alert and oriented, No acute distress. HEENT: [place] AT, moist mucus membranes.  Trachea midline, no masses. Cardiovascular: No clubbing, cyanosis, or edema. Respiratory: Normal respiratory effort, no increased work of  breathing. GI: Abdomen is soft, nontender, nondistended, no abdominal masses GU: No CVA tenderness. Normal phallus. Left hemiscrotum and left testicle unremarkable. Right testicle epididymis consistent with right epididymal orchitis. There is reactive erythema of the scrotal skin. Distended touch. There is a reactive hydrocele. No sign of Fournier's. No sign of crepitus. No drainage. No abscess. Skin: No rashes, bruises or suspicious lesions. Lymph: No cervical or inguinal adenopathy. Neurologic: Grossly intact, no focal deficits, moving all 4 extremities. Psychiatric: Normal mood and affect.  Laboratory Data: Lab Results  Component Value Date   WBC 11.4 (H) 05/26/2017   HGB 14.7 05/26/2017   HCT 43.0 05/26/2017   MCV 88.0 05/26/2017   PLT 242 05/26/2017    Lab Results  Component Value Date   CREATININE 0.91 05/26/2017    No results found for: PSA  No results found for: TESTOSTERONE  Lab Results  Component Value Date   HGBA1C 5.1 03/01/2017    Urinalysis    Component Value Date/Time   COLORURINE YELLOW (A) 05/26/2017 0951   APPEARANCEUR CLEAR (A) 05/26/2017 0951   LABSPEC 1.018 05/26/2017 0951   PHURINE 6.0 05/26/2017 [place] 05/26/2017 [place] 05/26/2017 [place] 05/26/2017 [place] 05/26/2017 0951   PROTEINUR NEGATIVE 05/26/2017 0951   NITRITE NEGATIVE 05/26/2017 0951   LEUKOCYTESUR NEGATIVE 05/26/2017 0951    Pertinent Imaging: Scrotal u/s reviewed as above  Assessment &  Plan:    1. Right epididymoorchitis I discussed withthe patient that his exam findings and ultrasound findings are consistent with epididymal orchitis. We discussed the natural course of this disease process. We did discuss that his pain should start to improve the next 7-10 days. He however was warned that these right scrotal swelling will take up to a month to fully resolve. Since he is not improved on doxycycline, he will be  switched to a 10 day course of ciprofloxacin. He was recommended to take NSAIDs on a scheduled basis. I also recommend scrotal support and icing. He will follow-up with me in 1-2 weeks to assess his progress.  Nickie Retort, MD  [facility] [address], [place], [place] [address] [phone]

## 2017-05-27 ENCOUNTER — Ambulatory Visit: Payer: Self-pay

## 2017-05-30 ENCOUNTER — Telehealth: Payer: Self-pay

## 2017-05-30 DIAGNOSIS — N453 Epididymo-orchitis: Secondary | ICD-10-CM

## 2017-05-30 MED ORDER — HYDROCODONE-ACETAMINOPHEN 5-300 MG PO TABS: ORAL_TABLET | ORAL | 0 refills | Status: DC

## 2017-05-30 NOTE — Telephone Encounter (Signed)
Pt wife called stating pt was seen in the ER on Thursday by Dr. Pilar Jarvis and was dx with epididymitis. Wife stated that pt is still in a lot of pain with swelling. Wife denied pt to have a fever. Reinforced with wife that it can take 4-6 weeks for pain and swelling to subside fully. Wife requested more pain meds. Per Dr. Junious Silk pt can have more pain meds and swelling with pain can take up to 6 weeks. Made wife aware. Wife voiced understanding.

## 2017-06-01 ENCOUNTER — Telehealth: Payer: Self-pay | Admitting: Urology

## 2017-06-01 NOTE — Telephone Encounter (Signed)
done

## 2017-06-01 NOTE — Telephone Encounter (Signed)
-----   Message from Nickie Retort, MD sent at 05/26/2017 12:54 PM EDT ----- Patient is supposed to see me tomorrow. Just saw in ED today. Can we change his appt for 1-2 weeks and cancel tomorrow? thanks

## 2017-06-09 ENCOUNTER — Ambulatory Visit (INDEPENDENT_AMBULATORY_CARE_PROVIDER_SITE_OTHER): Payer: PPO | Admitting: Urology

## 2017-06-09 ENCOUNTER — Encounter: Payer: Self-pay | Admitting: Urology

## 2017-06-09 VITALS — BP 115/78 | HR 67 | Ht 73.0 in | Wt 277.0 lb

## 2017-06-09 DIAGNOSIS — N453 Epididymo-orchitis: Secondary | ICD-10-CM | POA: Diagnosis not present

## 2017-06-09 NOTE — Progress Notes (Signed)
06/09/2017 11:28 AM   Jonathan Aguirre [birth year] [id number]  Referring provider: No referring provider defined for this encounter.  No chief complaint on file.   HPI: The patient is a 46 year old gentleman who presents today for follow-up after being diagnosed with right epididymal orchitis the emergency department.  He was initially discharged from the ED on doxycycline after having 1 dose of Rocephin.  He returned to the emergency department with increased swelling a few days later was switched to Cipro.  Scrotal ultrasound at that time confirmed right abdominal orchitis with no sign of abscess.  He returns today stating that he is much improved. Pain has resolved. Swelling has improved but still present.  No fevers or chills.  Reports that he thinks he is about 80% better.  He is happy with his improvement.   PMH: Past Medical History:  Diagnosis Date  . Anxiety and depression   . GERD (gastroesophageal reflux disease)   . Hypertension     Surgical History: Past Surgical History:  Procedure Laterality Date  . COLON SURGERY      Home Medications:  Allergies as of 06/09/2017   No Known Allergies     Medication List       Accurate as of 06/09/17 11:28 AM. Always use your most recent med list.          atenolol 25 MG tablet Commonly known as:  TENORMIN Take 25 mg by mouth daily.   cyclobenzaprine 10 MG tablet Commonly known as:  FLEXERIL Take 10 mg by mouth daily as needed for muscle spasms.   diphenhydrAMINE 25 MG tablet Commonly known as:  SOMINEX Take 50 mg by mouth at bedtime.   ibuprofen 600 MG tablet Commonly known as:  ADVIL,MOTRIN Take 1 tablet (600 mg total) by mouth every 6 (six) hours as needed.   lamoTRIgine 100 MG tablet Commonly known as:  LAMICTAL Take 100 mg by mouth daily.   multivitamin tablet Take 1 tablet by mouth daily.   omeprazole 20 MG capsule Commonly known as:  PRILOSEC Take 20 mg by mouth 2 (two) times daily before a  meal.   rosuvastatin 5 MG tablet Commonly known as:  CRESTOR Take 1 tablet (5 mg total) by mouth daily.   traZODone 100 MG tablet Commonly known as:  DESYREL Take 200 mg by mouth at bedtime.       Allergies: No Known Allergies  Family History: Family History  Problem Relation Age of Onset  . Heart failure Mother   . Hyperlipidemia Mother   . Hypertension Mother   . Prostate cancer Neg Hx   . Bladder Cancer Neg Hx   . Kidney cancer Neg Hx     Social History:  reports that he has never smoked. He has never used smokeless tobacco. He reports that he does not drink alcohol or use drugs.  ROS:                                        Physical Exam: BP 115/78 (BP Location: Right Arm, Patient Position: Sitting, Cuff Size: Large)   Pulse 67   Ht 6\' 1"  (1.854 m)   Wt 277 lb (125.6 kg)   BMI 36.55 kg/m   Constitutional:  Alert and oriented, No acute distress. HEENT: Sneads AT, moist mucus membranes.  Trachea midline, no masses. Cardiovascular: No clubbing, cyanosis, or edema. Respiratory: Normal respiratory effort, no  increased work of breathing. GI: Abdomen is soft, nontender, nondistended, no abdominal masses GU: No CVA tenderness.  Normal phallus.  Normal left testicle.  Right testicle still with mild edema.  Approximately twice its normal size.  However, it is much improved from previous exam.  No abscess.  No significant scrotal edema. Skin: No rashes, bruises or suspicious lesions. Lymph: No cervical or inguinal adenopathy. Neurologic: Grossly intact, no focal deficits, moving all 4 extremities. Psychiatric: Normal mood and affect.  Laboratory Data: Lab Results  Component Value Date   WBC 11.4 (H) 05/26/2017   HGB 14.7 05/26/2017   HCT 43.0 05/26/2017   MCV 88.0 05/26/2017   PLT 242 05/26/2017    Lab Results  Component Value Date   CREATININE 0.91 05/26/2017    No results found for: PSA  No results found for: TESTOSTERONE  Lab Results    Component Value Date   HGBA1C 5.1 03/01/2017    Urinalysis    Component Value Date/Time   COLORURINE YELLOW (A) 05/26/2017 0951   APPEARANCEUR CLEAR (A) 05/26/2017 0951   LABSPEC 1.018 05/26/2017 0951   PHURINE 6.0 05/26/2017 [place] 05/26/2017 0951   [place] 05/26/2017 [place] 05/26/2017 [place] 05/26/2017 0951   [place] 05/26/2017 0951   NITRITE NEGATIVE 05/26/2017 [place] 05/26/2017 0951    Assessment & Plan:    1.  Right epididymoorchitis The patient is symptomatically improving from his episode of right epididymal orchitis.  He was advised that his testicular edema will take 1-2 months to completely resolve.  Since he is doing well at this time, he can follow-up with [place] as needed.-  Return if symptoms worsen or fail to improve.  Nickie Retort, MD  [facility] [address] [place],  [id number] [phone]

## 2017-06-15 DIAGNOSIS — F4001 Agoraphobia with panic disorder: Secondary | ICD-10-CM | POA: Diagnosis not present

## 2017-06-15 DIAGNOSIS — F411 Generalized anxiety disorder: Secondary | ICD-10-CM | POA: Diagnosis not present

## 2017-07-09 ENCOUNTER — Other Ambulatory Visit: Payer: Self-pay

## 2017-07-09 DIAGNOSIS — K529 Noninfective gastroenteritis and colitis, unspecified: Secondary | ICD-10-CM | POA: Insufficient documentation

## 2017-07-09 DIAGNOSIS — Z79899 Other long term (current) drug therapy: Secondary | ICD-10-CM | POA: Diagnosis not present

## 2017-07-09 DIAGNOSIS — I10 Essential (primary) hypertension: Secondary | ICD-10-CM | POA: Diagnosis not present

## 2017-07-09 DIAGNOSIS — R1031 Right lower quadrant pain: Secondary | ICD-10-CM | POA: Insufficient documentation

## 2017-07-09 DIAGNOSIS — R197 Diarrhea, unspecified: Secondary | ICD-10-CM | POA: Diagnosis not present

## 2017-07-09 DIAGNOSIS — R109 Unspecified abdominal pain: Secondary | ICD-10-CM | POA: Diagnosis not present

## 2017-07-09 LAB — COMPREHENSIVE METABOLIC PANEL
ALT: 25 U/L (ref 17–63)
AST: 21 U/L (ref 15–41)
Albumin: 4.4 g/dL (ref 3.5–5.0)
Alkaline Phosphatase: 51 U/L (ref 38–126)
Anion gap: 9 (ref 5–15)
BUN: 8 mg/dL (ref 6–20)
CHLORIDE: 107 mmol/L (ref 101–111)
CO2: 22 mmol/L (ref 22–32)
CREATININE: 0.93 mg/dL (ref 0.61–1.24)
Calcium: 9.4 mg/dL (ref 8.9–10.3)
GFR calc Af Amer: >60 mL/min (ref >60)
GFR calc non Af Amer: >60 mL/min (ref >60)
Glucose, Bld: 112 mg/dL — ABNORMAL HIGH (ref 65–99)
Potassium: 3.9 mmol/L (ref 3.5–5.1)
SODIUM: 138 mmol/L (ref 135–145)
Total Bilirubin: 1 mg/dL (ref 0.3–1.2)
Total Protein: 7.4 g/dL (ref 6.5–8.1)

## 2017-07-09 LAB — CBC
HCT: 47.2 % (ref 40.0–52.0)
Hemoglobin: 15.9 g/dL (ref 13.0–18.0)
MCH: 29.1 pg (ref 26.0–34.0)
MCHC: 33.7 g/dL (ref 32.0–36.0)
MCV: 86.3 fL (ref 80.0–100.0)
PLATELETS: 219 10*3/uL (ref 150–440)
RBC: 5.47 MIL/uL (ref 4.40–5.90)
RDW: 13.7 % (ref 11.5–14.5)
WBC: 8.1 10*3/uL (ref 3.8–10.6)

## 2017-07-09 LAB — LIPASE, BLOOD: LIPASE: 31 U/L (ref 11–51)

## 2017-07-09 NOTE — ED Triage Notes (Signed)
Patient sitting on bench with wife in no acute distress at this time.

## 2017-07-09 NOTE — ED Triage Notes (Signed)
Reports right lower abdominal pain with diarrhea and fever (last pm).

## 2017-07-10 ENCOUNTER — Emergency Department
Admission: EM | Admit: 2017-07-10 | Discharge: 2017-07-10 | Disposition: A | Discharge status: Home / Self Care | Payer: PPO | Attending: Emergency Medicine | Admitting: Emergency Medicine

## 2017-07-10 ENCOUNTER — Emergency Department: Payer: PPO

## 2017-07-10 ENCOUNTER — Encounter: Payer: Self-pay | Admitting: Radiology

## 2017-07-10 DIAGNOSIS — R1031 Right lower quadrant pain: Secondary | ICD-10-CM

## 2017-07-10 DIAGNOSIS — K529 Noninfective gastroenteritis and colitis, unspecified: Secondary | ICD-10-CM

## 2017-07-10 DIAGNOSIS — R109 Unspecified abdominal pain: Secondary | ICD-10-CM | POA: Diagnosis not present

## 2017-07-10 DIAGNOSIS — R197 Diarrhea, unspecified: Secondary | ICD-10-CM

## 2017-07-10 LAB — GASTROINTESTINAL PANEL BY PCR, STOOL (REPLACES STOOL CULTURE)
ADENOVIRUS F40/41: NOT DETECTED
ASTROVIRUS: NOT DETECTED
CRYPTOSPORIDIUM: NOT DETECTED
CYCLOSPORA CAYETANENSIS: NOT DETECTED
Campylobacter species: DETECTED — AB
ENTEROPATHOGENIC E COLI (EPEC): DETECTED — AB
ENTEROTOXIGENIC E COLI (ETEC): NOT DETECTED
Entamoeba histolytica: NOT DETECTED
Enteroaggregative E coli (EAEC): NOT DETECTED
GIARDIA LAMBLIA: NOT DETECTED
Norovirus GI/GII: NOT DETECTED
PLESIMONAS SHIGELLOIDES: NOT DETECTED
ROTAVIRUS A: NOT DETECTED
SAPOVIRUS (I, II, IV, AND V): NOT DETECTED
Salmonella species: NOT DETECTED
Shiga like toxin producing E coli (STEC): NOT DETECTED
Shigella/Enteroinvasive E coli (EIEC): NOT DETECTED
Vibrio cholerae: NOT DETECTED
Vibrio species: NOT DETECTED
YERSINIA ENTEROCOLITICA: NOT DETECTED

## 2017-07-10 LAB — C DIFFICILE QUICK SCREEN W PCR REFLEX
C DIFFICILE (CDIFF) INTERP: NOT DETECTED
C DIFFICLE (CDIFF) ANTIGEN: NEGATIVE
C Diff toxin: NEGATIVE

## 2017-07-10 LAB — URINALYSIS, COMPLETE (UACMP) WITH MICROSCOPIC
BACTERIA UA: NONE SEEN
Bilirubin Urine: NEGATIVE
GLUCOSE, UA: NEGATIVE mg/dL
Hgb urine dipstick: NEGATIVE
KETONES UR: NEGATIVE mg/dL
Leukocytes, UA: NEGATIVE
Nitrite: NEGATIVE
PROTEIN: NEGATIVE mg/dL
Specific Gravity, Urine: 1.018 (ref 1.005–1.030)
Squamous Epithelial / LPF: NONE SEEN
pH: 5 (ref 5.0–8.0)

## 2017-07-10 MED ORDER — ONDANSETRON HCL 4 MG/2ML IJ SOLN
4.0000 mg | Freq: Once | INTRAMUSCULAR | Status: AC
  Administered 2017-07-10: 4 mg via INTRAVENOUS
  Filled 2017-07-10: qty 2

## 2017-07-10 MED ORDER — METRONIDAZOLE IN NACL 5-0.79 MG/ML-% IV SOLN
500.0000 mg | Freq: Once | INTRAVENOUS | Status: DC
  Filled 2017-07-10: qty 100

## 2017-07-10 MED ORDER — METRONIDAZOLE 500 MG PO TABS
500.0000 mg | ORAL_TABLET | Freq: Once | ORAL | Status: AC
  Administered 2017-07-10: 500 mg via ORAL
  Filled 2017-07-10: qty 1

## 2017-07-10 MED ORDER — ONDANSETRON 4 MG PO TBDP: 4.0000 mg | ORAL_TABLET | Freq: Three times a day (TID) | ORAL | 0 refills | Status: DC | PRN

## 2017-07-10 MED ORDER — MORPHINE SULFATE (PF) 4 MG/ML IV SOLN
4.0000 mg | Freq: Once | INTRAVENOUS | Status: AC
  Administered 2017-07-10: 4 mg via INTRAVENOUS
  Filled 2017-07-10: qty 1

## 2017-07-10 MED ORDER — SODIUM CHLORIDE 0.9 % IV BOLUS (SEPSIS)
1000.0000 mL | Freq: Once | INTRAVENOUS | Status: AC
  Administered 2017-07-10: 1000 mL via INTRAVENOUS

## 2017-07-10 MED ORDER — METRONIDAZOLE 500 MG PO TABS: 500.0000 mg | ORAL_TABLET | Freq: Two times a day (BID) | ORAL | 0 refills | Status: DC

## 2017-07-10 MED ORDER — CIPROFLOXACIN HCL 500 MG PO TABS: 500.0000 mg | ORAL_TABLET | Freq: Two times a day (BID) | ORAL | 0 refills | Status: DC

## 2017-07-10 MED ORDER — CIPROFLOXACIN IN D5W 400 MG/200ML IV SOLN
400.0000 mg | Freq: Once | INTRAVENOUS | Status: AC
Start: 2017-07-10 — End: 2017-07-10
  Administered 2017-07-10: 400 mg via INTRAVENOUS

## 2017-07-10 MED ORDER — IOPAMIDOL (ISOVUE-300) INJECTION 61%
30.0000 mL | Freq: Once | INTRAVENOUS | Status: AC
  Administered 2017-07-10: 30 mL via ORAL

## 2017-07-10 MED ORDER — DICYCLOMINE HCL 10 MG PO CAPS
10.0000 mg | ORAL_CAPSULE | Freq: Once | ORAL | Status: AC
  Administered 2017-07-10: 10 mg via ORAL
  Filled 2017-07-10: qty 1

## 2017-07-10 MED ORDER — OXYCODONE-ACETAMINOPHEN 5-325 MG PO TABS: 1.0000 | ORAL_TABLET | ORAL | 0 refills | Status: DC | PRN

## 2017-07-10 MED ORDER — IOPAMIDOL (ISOVUE-300) INJECTION 61%
125.0000 mL | Freq: Once | INTRAVENOUS | Status: AC | PRN
  Administered 2017-07-10: 150 mL via INTRAVENOUS

## 2017-07-10 MED ORDER — DICYCLOMINE HCL 10 MG PO CAPS: 10.0000 mg | ORAL_CAPSULE | Freq: Four times a day (QID) | ORAL | 0 refills | Status: DC | PRN

## 2017-07-10 NOTE — ED Provider Notes (Signed)
C. difficile test negative  Patient ambulatory, mobile in room with IV fall.  He has wife requests that if possible he would like to be able to take Flagyl by mouth, obtain 1 more dose of pain medicine and then be able to be discharged home.  I think this is reasonable, I will add Bentyl as well at the request of his family member who is familiar with patient's care.  Patient will follow up with his primary doctor, he reports he is feeling better.  He is awake alert with stable hemodynamics.  Reviewed treatment recommendations, antibiotics, and return precautions for which she is agreeable.  Wife will be driving him home and he understands not to drive today or while taking oxycodone.   Delman Kitten, MD 07/10/17 [phone]

## 2017-07-10 NOTE — ED Triage Notes (Signed)
Patient sitting on bench in no acute distress at this time.

## 2017-07-10 NOTE — Discharge Instructions (Addendum)
1.  Take antibiotics as prescribed (Cipro/Flagyl 500 mg twice daily x 7 days). 2.  You may take medicines as needed for pain and nausea (Percocet/Zofran #20). 3.  BRAT diet times 3 days, then slowly advance diet as tolerated. 4.  Return to the ER for worsening symptoms, persistent vomiting, fever, difficulty breathing or other concerns.

## 2017-07-10 NOTE — ED Provider Notes (Signed)
[facility] Emergency Department Provider Note   ____________________________________________   First MD Initiated Contact with Patient 07/10/17 [phone]     (approximate)  I have reviewed the triage vital signs and the nursing notes.   HISTORY  Chief Complaint Abdominal Pain and Diarrhea    HPI Jonathan Aguirre is a 46 y.o. male who presents to the ED from home with a chief complaint of abdominal pain.  Patient reports a 2-day history of abdominal pain, initially around his umbilicus and now is localized to his right lower quadrant.  Complains of subjective fever, nausea and diarrhea.  Had been on vacation in [place] and wanted to wait to be evaluated until he got home to [place].  Has a history of diverticulitis status post partial colectomy.  Denies associated chills, chest pain, shortness of breath, vomiting, dysuria.  Denies recent antibiotic use.  Denies recent trauma.   Past Medical History:  Diagnosis Date  . Anxiety and depression   . GERD (gastroesophageal reflux disease)   . Hypertension     Patient Active Problem List   Diagnosis Date Noted  . Shortness of breath 12/14/2016  . Hypertension 12/14/2016  . Chest pain 12/14/2016  . Family history of premature CAD 12/14/2016  . Morbid obesity ([place]) 12/14/2016  . Anxiety 12/14/2016  . Dizziness 12/14/2016    Past Surgical History:  Procedure Laterality Date  . COLON SURGERY      Prior to Admission medications   Medication Sig Start Date End Date Taking? Authorizing Provider  atenolol (TENORMIN) 25 MG tablet Take 25 mg by mouth daily.    Yes [provider]  ibuprofen (ADVIL,MOTRIN) 600 MG tablet Take 1 tablet (600 mg total) by mouth every 6 (six) hours as needed. 05/24/17  Yes Alfred Levins, [place], MD  lamoTRIgine (LAMICTAL) 100 MG tablet Take 100 mg by mouth daily.   Yes [provider]  Multiple Vitamin (MULTIVITAMIN) tablet Take 1 tablet by mouth daily.   Yes [provider]  omeprazole (PRILOSEC) 20 MG capsule Take 20 mg by mouth 2 (two) times daily before a meal.   Yes [provider]  rosuvastatin (CRESTOR) 5 MG tablet Take 1 tablet (5 mg total) by mouth daily. 01/31/17 07/09/18 Yes Gollan, Kathlene November, MD  traZODone (DESYREL) 100 MG tablet Take 200 mg by mouth at bedtime.   Yes [provider]  ciprofloxacin (CIPRO) 500 MG tablet Take 1 tablet (500 mg total) by mouth 2 (two) times daily. 07/10/17   Paulette Blanch, MD  cyclobenzaprine (FLEXERIL) 10 MG tablet Take 10 mg by mouth daily as needed for muscle spasms.    [provider]  metroNIDAZOLE (FLAGYL) 500 MG tablet Take 1 tablet (500 mg total) by mouth 2 (two) times daily. 07/10/17   Paulette Blanch, MD  ondansetron (ZOFRAN ODT) 4 MG disintegrating tablet Take 1 tablet (4 mg total) by mouth every 8 (eight) hours as needed for nausea or vomiting. 07/10/17   Paulette Blanch, MD  oxyCODONE-acetaminophen (ROXICET) 5-325 MG tablet Take 1 tablet by mouth every 4 (four) hours as needed for severe pain. 07/10/17   Paulette Blanch, MD    Allergies Patient has no known allergies.  Family History  Problem Relation Age of Onset  . Heart failure Mother   . Hyperlipidemia Mother   . Hypertension Mother   . Prostate cancer Neg Hx   . Bladder Cancer Neg Hx   . Kidney cancer Neg Hx  Social History Social History   Tobacco Use  . Smoking status: Never Smoker  . Smokeless tobacco: Never Used  Substance Use Topics  . Alcohol use: No  . Drug use: No    Review of Systems  Constitutional: Positive for fever.  Negative for chills. Eyes: No visual changes. ENT: No sore throat. Cardiovascular: Denies chest pain. Respiratory: Denies shortness of breath. Gastrointestinal: Positive for abdominal pain and nausea, no vomiting.  Positive for diarrhea.  No constipation. Genitourinary: Negative for dysuria. Musculoskeletal: Negative for back pain. Skin: Negative for rash. Neurological:  Negative for headaches, focal weakness or numbness.   ____________________________________________   PHYSICAL EXAM:  VITAL SIGNS: ED Triage Vitals  Enc Vitals Group     BP 07/09/17 2314 127/89     Pulse Rate 07/09/17 2314 100     Resp 07/09/17 2314 20     Temp 07/09/17 2314 99.1 F (37.3 C)     Temp Source 07/09/17 2314 Oral     SpO2 07/09/17 2314 95 %     Weight 07/09/17 2315 275 lb (124.7 kg)     Height 07/09/17 2315 6\' 1"  (1.854 m)     Head Circumference --      Peak Flow --      Pain Score 07/09/17 2314 3     Pain Loc --      Pain Edu? --      Excl. in [place]? --     Constitutional: Alert and oriented. Well appearing and in mild acute distress. Eyes: Conjunctivae are normal. PERRL. EOMI. Head: Atraumatic. Nose: No congestion/rhinnorhea. Mouth/Throat: Mucous membranes are moist.  Oropharynx non-erythematous. Neck: No stridor.   Cardiovascular: Normal rate, regular rhythm. Grossly normal heart sounds.  Good peripheral circulation. Respiratory: Normal respiratory effort.  No retractions. Lungs CTAB. Gastrointestinal: Soft and mildly tender to palpation right lower quadrant without rebound or guarding. No distention. No abdominal bruits. No CVA tenderness. Musculoskeletal: No lower extremity tenderness nor edema.  No joint effusions. Neurologic:  Normal speech and language. No gross focal neurologic deficits are appreciated. No gait instability. Skin:  Skin is warm, dry and intact. No rash noted. Psychiatric: Mood and affect are normal. Speech and behavior are normal.  ____________________________________________   LABS (all labs ordered are listed, but only abnormal results are displayed)  Labs Reviewed  COMPREHENSIVE METABOLIC PANEL - Abnormal; Notable for the following components:      Result Value   Glucose, Bld 112 (*)    All other components within normal limits  URINALYSIS, COMPLETE (UACMP) WITH MICROSCOPIC - Abnormal; Notable for the following components:    Color, Urine YELLOW (*)    APPearance CLEAR (*)    All other components within normal limits  C DIFFICILE QUICK SCREEN W PCR REFLEX  GASTROINTESTINAL PANEL BY PCR, STOOL (REPLACES STOOL CULTURE)  LIPASE, BLOOD  CBC   ____________________________________________  EKG  None ____________________________________________  RADIOLOGY  Ct Abdomen Pelvis W Contrast  Result Date: 07/10/2017 CLINICAL DATA:  Initial evaluation for acute right lower quadrant abdominal pain. EXAM: CT ABDOMEN AND PELVIS WITH CONTRAST TECHNIQUE: Multidetector CT imaging of the abdomen and pelvis was performed using the standard protocol following bolus administration of intravenous contrast. CONTRAST:  161mL ISOVUE-300 IOPAMIDOL (ISOVUE-300) INJECTION 61% COMPARISON:  Prior ultrasound from 05/26/2017. FINDINGS: Lower chest: Visualized lung bases are clear. Hepatobiliary: Mild hepatic steatosis. The ache area of hyperenhancement within the subcapsular right hepatic lobe favored to reflect AV shunting. Liver otherwise unremarkable. Gallbladder within normal limits. No biliary dilatation. Pancreas:  Pancreas within normal limits. Spleen: Spleen within normal limits. Adrenals/Urinary Tract: Adrenal glands are normal. Kidneys equal in size with symmetric enhancement. For 2 mm cyst present within the lower pole the left kidney. Additional scattered subcentimeter hypodensities too small the characterize by CT. Nonobstructive calculi measuring up to 9 mm present within the left kidney as well. No definite right-sided calculi. No hydronephrosis or focal enhancing renal mass. No hydroureter. Small diverticulum seen extending from the left aspect of the bladder. Bladder otherwise unremarkable. Stomach/Bowel: Stomach within normal limits. No evidence for bowel obstruction. Appendix within normal limits. Sequelae of prior partial colectomy with anastomotic suture present within the lower mid abdomen. Mild wall thickening with hazy  inflammatory stranding seen about the colon at and just proximal to the anastomotic suture, suspicious for acute colitis and/or diverticulitis. No evidence for perforation or other complication. No other acute inflammatory changes seen about the bowels. Vascular/Lymphatic: Normal intravascular enhancement seen throughout the intra-abdominal aorta and its branch vessels. Few mildly prominent mesenteric lymph nodes seen clustered within the lower abdomen, likely reactive. No other adenopathy. Reproductive: Prostate normal. Other: Small fat containing left inguinal hernia. No free air or fluid. Musculoskeletal: No acute osseous abnormality. No worrisome lytic or blastic osseous lesions. IMPRESSION: 1. Hazy inflammatory stranding with mild wall thickening about the colon near the site of previous colectomy in the lower mid abdomen, suspicious for acute colitis and/or diverticulitis. No evidence for perforation or other complication. 2. No other acute intra-abdominal or pelvic process. Normal appendix. 3. Nonobstructive left renal nephrolithiasis. 4. Hepatic steatosis. Electronically Signed   By: Jeannine Boga M.D.   On: 07/10/2017 06:22    ____________________________________________   PROCEDURES  Procedure(s) performed: None  Procedures  Critical Care performed: No  ____________________________________________   INITIAL IMPRESSION / ASSESSMENT AND PLAN / ED COURSE  As part of my medical decision making, I reviewed the following data within the [place] History obtained from family, Nursing notes reviewed and incorporated, Labs reviewed, Radiograph reviewed and Notes from prior ED visits.   46 year old male with a history of diverticulitis status post partial colectomy who presents with right lower quadrant abdominal pain, nausea and diarrhea. Differential diagnosis includes, but is not limited to, acute appendicitis, renal colic, testicular torsion, urinary tract  infection/pyelonephritis, prostatitis,  epididymitis, diverticulitis, small bowel obstruction or ileus, colitis, abdominal aortic aneurysm, gastroenteritis, hernia, etc.  Updated patient and spouse on unremarkable labs.  Awaiting urinalysis.  Will check C. difficile and stool samples.  Will administer IV analgesia and proceed to CT abdomen/pelvis to evaluate etiology of patient's abdominal pain.  Clinical Course as of Jul 10 702  Nancy Fetter Jul 10, 2017  0637 Patient resting in no acute distress.  Updated patient and spouse of CT imaging results.  Encouraged him to stay until C. difficile is resulted as that would influence the antibiotic he is discharged home with.  [JS]  0701 Awaiting stool results. Care transferred to Dr. Jacqualine Code. Anticipate discharge home. If patient is  C. Diff positive he will require PO vancomycin rather than cipro/flagyl.  [JS]    Clinical Course User Index [JS] Paulette Blanch, MD     ____________________________________________   FINAL CLINICAL IMPRESSION(S) / ED DIAGNOSES  Final diagnoses:  Right lower quadrant abdominal pain  Diarrhea, unspecified type  Colitis     ED Discharge Orders        Ordered    ciprofloxacin (CIPRO) 500 MG tablet  2 times daily     07/10/17 0655  metroNIDAZOLE (FLAGYL) 500 MG tablet  2 times daily     07/10/17 0655    oxyCODONE-acetaminophen (ROXICET) 5-325 MG tablet  Every 4 hours PRN     07/10/17 0655    ondansetron (ZOFRAN ODT) 4 MG disintegrating tablet  Every 8 hours PRN     07/10/17 0655       Note:  This document was prepared using Dragon voice recognition software and may include unintentional dictation errors.    Paulette Blanch, MD 07/10/17 [phone]

## 2017-07-10 NOTE — ED Notes (Signed)
Patient transported to CT 

## 2017-07-11 ENCOUNTER — Telehealth: Payer: Self-pay | Admitting: Emergency Medicine

## 2017-07-11 NOTE — Telephone Encounter (Signed)
Called patient to give stool results.  The result was reviewed by Dr. Corky Downs and no changes in treatment are recommended.  Patient should continue current antibiotics.  Spoke to patient and gave results and instructions.

## 2017-09-13 DIAGNOSIS — F411 Generalized anxiety disorder: Secondary | ICD-10-CM | POA: Diagnosis not present

## 2017-09-13 DIAGNOSIS — F4001 Agoraphobia with panic disorder: Secondary | ICD-10-CM | POA: Diagnosis not present

## 2017-09-26 DIAGNOSIS — F339 Major depressive disorder, recurrent, unspecified: Secondary | ICD-10-CM | POA: Diagnosis not present

## 2017-12-13 DIAGNOSIS — F411 Generalized anxiety disorder: Secondary | ICD-10-CM | POA: Diagnosis not present

## 2017-12-13 DIAGNOSIS — F4001 Agoraphobia with panic disorder: Secondary | ICD-10-CM | POA: Diagnosis not present

## 2017-12-18 DIAGNOSIS — L03111 Cellulitis of right axilla: Secondary | ICD-10-CM | POA: Diagnosis not present

## 2017-12-18 DIAGNOSIS — B372 Candidiasis of skin and nail: Secondary | ICD-10-CM | POA: Diagnosis not present

## 2018-02-23 ENCOUNTER — Other Ambulatory Visit: Payer: Self-pay | Admitting: Cardiovascular Disease

## 2018-02-27 ENCOUNTER — Telehealth: Payer: Self-pay | Admitting: Cardiovascular Disease

## 2018-02-27 NOTE — Telephone Encounter (Signed)
Spoke to patient He will ask spouse to call and set this up  Will await call back

## 2018-02-27 NOTE — Telephone Encounter (Signed)
-----   Message from Anselm Pancoast, [place] sent at 02/24/2018 10:39 AM EDT ----- Please contact patient for a follow up appointment.  Thanks, Ivin Booty

## 2018-02-28 NOTE — Telephone Encounter (Signed)
Pt is coming 04/18/18  Please send in refills until then

## 2018-02-28 NOTE — Telephone Encounter (Signed)
Lmov for spouse to call and schedule appointment

## 2018-04-12 DIAGNOSIS — Z8719 Personal history of other diseases of the digestive system: Secondary | ICD-10-CM | POA: Diagnosis not present

## 2018-04-12 DIAGNOSIS — Z87891 Personal history of nicotine dependence: Secondary | ICD-10-CM | POA: Diagnosis not present

## 2018-04-12 DIAGNOSIS — I10 Essential (primary) hypertension: Secondary | ICD-10-CM | POA: Diagnosis not present

## 2018-04-12 DIAGNOSIS — G4700 Insomnia, unspecified: Secondary | ICD-10-CM | POA: Diagnosis not present

## 2018-04-12 DIAGNOSIS — K760 Fatty (change of) liver, not elsewhere classified: Secondary | ICD-10-CM | POA: Diagnosis not present

## 2018-04-12 DIAGNOSIS — K56609 Unspecified intestinal obstruction, unspecified as to partial versus complete obstruction: Secondary | ICD-10-CM | POA: Diagnosis not present

## 2018-04-12 DIAGNOSIS — Z9049 Acquired absence of other specified parts of digestive tract: Secondary | ICD-10-CM | POA: Diagnosis not present

## 2018-04-12 DIAGNOSIS — F329 Major depressive disorder, single episode, unspecified: Secondary | ICD-10-CM | POA: Diagnosis not present

## 2018-04-12 DIAGNOSIS — K219 Gastro-esophageal reflux disease without esophagitis: Secondary | ICD-10-CM | POA: Diagnosis not present

## 2018-04-12 DIAGNOSIS — E785 Hyperlipidemia, unspecified: Secondary | ICD-10-CM | POA: Diagnosis not present

## 2018-04-12 DIAGNOSIS — K565 Intestinal adhesions [bands], unspecified as to partial versus complete obstruction: Secondary | ICD-10-CM | POA: Diagnosis not present

## 2018-04-12 DIAGNOSIS — N200 Calculus of kidney: Secondary | ICD-10-CM | POA: Diagnosis not present

## 2018-04-12 DIAGNOSIS — R1084 Generalized abdominal pain: Secondary | ICD-10-CM | POA: Diagnosis not present

## 2018-04-17 NOTE — Progress Notes (Signed)
Cardiology Office Note  Date:  04/18/2018   ID:  Jonathan Aguirre, DOB 05-29-1971, MRN [id number]  PCP:  System, Pcp Not In   Chief Complaint  Patient presents with  . other    Ls 5/18 Pt needing refills. Pt recently Hospital in [place] due to "Bile Obstruction". Meds reviewed verbally with pt.    HPI:  Mr. Jonathan Aguirre is a pleasant 47 year old gentleman with history of Obesity Anxiety/depression HTN Back pain H/A GERD Chronic chest pain Who presents for chest pain and shortness of breath  [place] trip,bowel obstruction Was in the hospital for several days on IV fluids and bowel rest  had a bowel movement and then was discharged home Reports that he drove to [place] and drove home Even today abdomen does not feel right, feels uncomfortable Sounds like he had episode of vasovagal orthostasis in the setting of a bowel movement in the hospital  Previous CT coronary calcium score reviewed with him 01/2017 Coronary calcium score of 46. This was 33 percentile for age and sex matched control. Pulmonary artery appears dilated.   Echo 02/2018 Left ventricle: The cavity size was normal. Wall thickness was   normal. Systolic function was normal. The estimated ejection   fraction was in the range of 50% to 55%. Wall motion was normal;   there were no regional wall motion abnormalities. Left   ventricular diastolic function parameters were normal. - Mitral valve: Mildly thickened leaflets . Systolic bowing without   prolapse. There was mild regurgitation. - Right ventricle: The cavity size was normal. Systolic function   was normal.  He is a farmer, very active at baseline, different types of animals Previously on atenolol changed to metoprolol Denies any tachycardia or palpitations  Long history of atypical chest pain Active, no significant chest pain with exertion  sometimes with shortness of breath Other activity such as carrying hay on the farm, he does not usually have  symptoms  Previous labs reviewed personally by myself and with the patient on todays visit HBA1C 5.1 Total chol 151, LDL 92  Non smoker No diabetes  EKG personally reviewed by myself on todays visit Shows normal sinus rhythm  no significant ST or T-wave changes   PMH:   has a past medical history of Anxiety and depression, GERD (gastroesophageal reflux disease), and Hypertension.  PSH:    Past Surgical History:  Procedure Laterality Date  . COLON SURGERY      Current Outpatient Medications  Medication Sig Dispense Refill  . cyclobenzaprine (FLEXERIL) 10 MG tablet Take 10 mg by mouth daily as needed for muscle spasms.    Marland Kitchen ibuprofen (ADVIL,MOTRIN) 600 MG tablet Take 1 tablet (600 mg total) by mouth every 6 (six) hours as needed. 20 tablet 0  . lamoTRIgine (LAMICTAL) 100 MG tablet Take 100 mg by mouth daily.    . metoprolol succinate (TOPROL-XL) 25 MG 24 hr tablet daily.    . Multiple Vitamin (MULTIVITAMIN) tablet Take 1 tablet by mouth daily.    Marland Kitchen omeprazole (PRILOSEC) 20 MG capsule Take 20 mg by mouth 2 (two) times daily before a meal.    . oxyCODONE-acetaminophen (ROXICET) 5-325 MG tablet Take 1 tablet by mouth every 4 (four) hours as needed for severe pain. 20 tablet 0  . rosuvastatin (CRESTOR) 5 MG tablet TAKE 1 TABLET BY MOUTH DAILY 30 tablet 0  . traZODone (DESYREL) 100 MG tablet Take 200 mg by mouth at bedtime.     No current facility-administered medications for this visit.  Allergies:   Patient has no known allergies.   Social History:  The patient  reports that he has never smoked. He has never used smokeless tobacco. He reports that he does not drink alcohol or use drugs.   Family History:   family history includes Heart failure in his mother; Hyperlipidemia in his mother; Hypertension in his mother.    Review of Systems: Review of Systems  Constitutional: Negative.   Respiratory: Negative.   Cardiovascular: Negative.   Gastrointestinal: Positive for  abdominal pain.  Musculoskeletal: Negative.   Neurological: Negative.   Psychiatric/Behavioral: Negative.   All other systems reviewed and are negative.    PHYSICAL EXAM: VS:  BP (!) 124/96 (BP Location: Left Arm, Patient Position: Sitting, Cuff Size: Large)   Ht 6\' 1"  (1.854 m)   Wt 270 lb 8 oz (122.7 kg)   BMI 35.69 kg/m  , BMI Body mass index is 35.69 kg/m. Constitutional:  oriented to person, place, and time. No distress.  HENT:  Head: Normocephalic and atraumatic.  Eyes:  no discharge. No scleral icterus.  Neck: Normal range of motion. Neck supple. No JVD present.  Cardiovascular: Normal rate, regular rhythm, normal heart sounds and intact distal pulses. Exam reveals no gallop and no friction rub. No edema No murmur heard. Pulmonary/Chest: Effort normal and breath sounds normal. No stridor. No respiratory distress.  no wheezes.  no rales.  no tenderness.  Abdominal: Soft.  no distension.  no tenderness.  Musculoskeletal: Normal range of motion.  no  tenderness or deformity.  Neurological:  normal muscle tone. Coordination normal. No atrophy Skin: Skin is warm and dry. No rash noted. not diaphoretic.  Psychiatric:  normal mood and affect. behavior is normal. Thought content normal.    Recent Labs: 07/09/2017: ALT 25; BUN 8; Creatinine, Ser 0.93; Hemoglobin 15.9; Platelets 219; Potassium 3.9; Sodium 138    Lipid Panel Lab Results  Component Value Date   CHOL 138 03/01/2017   HDL 37 (L) 03/01/2017   LDLCALC 77 03/01/2017   TRIG 121 03/01/2017      Wt Readings from Last 3 Encounters:  04/18/18 270 lb 8 oz (122.7 kg)  07/09/17 275 lb (124.7 kg)  06/09/17 277 lb (125.6 kg)       ASSESSMENT AND PLAN:  Chest pain, unspecified type - Atypical symptoms No further workup needed  Hypertension, unspecified type -  Blood pressure is well controlled on today's visit. No changes made to the medications. stable  Family history of premature CAD - Plan: CT CARDIAC  SCORING Given strong family history Continue low-dose statin  Shortness of breath Shortness of breath likely secondary to deconditioning and weight Recommended regular exercise program, weight loss Again discussed with him today  Morbid obesity ([place]) We have encouraged continued exercise, careful diet management in an effort to lose weight. Weight still above his goal  Anxiety Long-standing issue, reports symptoms are relatively well-controlled  Dizziness Nonactive issue at this time  Disposition:   F/U  as needed   Total encounter time more than 25 minutes  Greater than 50% was spent in counseling and coordination of care with the patient    Orders Placed This Encounter  Procedures  . EKG 12-Lead     Signed, Esmond Plants, M.D., Ph.D. 04/18/2018  [place], [place]

## 2018-04-18 ENCOUNTER — Ambulatory Visit: Payer: PPO | Admitting: Cardiovascular Disease

## 2018-04-18 ENCOUNTER — Encounter: Payer: Self-pay | Admitting: Cardiovascular Disease

## 2018-04-18 VITALS — BP 124/96 | HR 95 | Ht 73.0 in | Wt 270.5 lb

## 2018-04-18 DIAGNOSIS — R079 Chest pain, unspecified: Secondary | ICD-10-CM | POA: Diagnosis not present

## 2018-04-18 DIAGNOSIS — R0602 Shortness of breath: Secondary | ICD-10-CM

## 2018-04-18 DIAGNOSIS — F419 Anxiety disorder, unspecified: Secondary | ICD-10-CM

## 2018-04-18 MED ORDER — ROSUVASTATIN CALCIUM 5 MG PO TABS: 5.0000 mg | ORAL_TABLET | Freq: Every day | ORAL | 4 refills | Status: DC

## 2018-04-18 NOTE — Patient Instructions (Signed)

## 2018-04-19 DIAGNOSIS — M25552 Pain in left hip: Secondary | ICD-10-CM | POA: Diagnosis not present

## 2018-04-19 DIAGNOSIS — Z23 Encounter for immunization: Secondary | ICD-10-CM | POA: Diagnosis not present

## 2018-04-19 DIAGNOSIS — E669 Obesity, unspecified: Secondary | ICD-10-CM | POA: Diagnosis not present

## 2018-04-19 DIAGNOSIS — K219 Gastro-esophageal reflux disease without esophagitis: Secondary | ICD-10-CM | POA: Diagnosis not present

## 2018-04-19 DIAGNOSIS — M1612 Unilateral primary osteoarthritis, left hip: Secondary | ICD-10-CM | POA: Diagnosis not present

## 2018-04-19 DIAGNOSIS — I10 Essential (primary) hypertension: Secondary | ICD-10-CM | POA: Diagnosis not present

## 2018-04-19 DIAGNOSIS — K565 Intestinal adhesions [bands], unspecified as to partial versus complete obstruction: Secondary | ICD-10-CM | POA: Diagnosis not present

## 2018-04-19 DIAGNOSIS — R140 Abdominal distension (gaseous): Secondary | ICD-10-CM | POA: Diagnosis not present

## 2018-04-19 DIAGNOSIS — M7602 Gluteal tendinitis, left hip: Secondary | ICD-10-CM | POA: Diagnosis not present

## 2018-04-21 ENCOUNTER — Other Ambulatory Visit: Payer: Self-pay | Admitting: *Deleted

## 2018-04-21 NOTE — Patient Outreach (Signed)
[facility]) Care Management  04/21/2018  Jonathan Aguirre Jun 04, 1971 [id number]   Transition of Care Referral   Referral Date: 04/18/18 Referral Source: HTA IP discharge  Date of Admission: unknown Diagnosis: unknown Date of Discharge: 04/14/18 Facility: [facility] Insurance: HTA   Outreach attempt # 1 No answer. [facility] RN CM left HIPAA compliant voicemail message along with CM's contact info.   Plan: [facility] RN CM sent an unsuccessful outreach letter and scheduled this patient for another call attempt within 4 business days  Mauricio Dahlen L. Lavina Hamman, RN, BSN, [place] Management Care Coordinator Direct Number [phone] Mobile number [phone]  Main [facility] number [id number] Fax number [phone]

## 2018-04-24 ENCOUNTER — Ambulatory Visit: Payer: Self-pay | Admitting: *Deleted

## 2018-04-25 ENCOUNTER — Ambulatory Visit: Payer: Self-pay | Admitting: *Deleted

## 2018-04-25 DIAGNOSIS — F411 Generalized anxiety disorder: Secondary | ICD-10-CM | POA: Diagnosis not present

## 2018-04-25 DIAGNOSIS — F4001 Agoraphobia with panic disorder: Secondary | ICD-10-CM | POA: Diagnosis not present

## 2018-04-26 ENCOUNTER — Ambulatory Visit: Payer: Self-pay | Admitting: *Deleted

## 2018-04-27 ENCOUNTER — Other Ambulatory Visit: Payer: Self-pay | Admitting: *Deleted

## 2018-04-27 NOTE — Patient Outreach (Signed)
[facility] [facility]) Care Management  04/27/2018  Jonathan Aguirre 13-May-1971 [id number]   Transition of Care Referral  Referral Date: 04/18/18 Referral Source: HTA IP discharge  Date of Admission: unknown Diagnosis: pt confirms a small bowel obstruction Date of Discharge: 04/14/18 Facility: [facility] Insurance: HTA   Outreach attempt # 2 Patient is able to verify HIPAA Reviewed and addressed referral to [facility] with patient He reports he is doing a lot better  He voiced the only remaining concern is his diet and he reports his MD states this would take time He denies DME needs   Social: Mr Denk lives at home with his wife He report being independent with some assist for iADLs from wife and family He is able to get transportation to medical appointments  Conditions: small bowel obstruction, HTN, chest pain, anxiety, dizziness, morbid obesity  Medications: denies concerns with taking medications as prescribed, affording medications, side effects of medications and questions about medications  Appointments: He reports seeing primary MD, D Feldpausch in [place] on 04/19/18   Advance Directives: Denies need for assist with or assist with changes for advance directives   Consent: [facility] RN CM reviewed [facility] services with patient. Patient gave verbal consent for services. He denies need of services from [facility]/Telephonic RN CM, pharmacy, health coach, NP or SW at this time  .  Plan: [facility] RN CM will close case at this time as patient has been assessed and no needs identified.    Mr Tarnow denies any needs [facility] RN CM sent a successful outreach letter as discussed with [facility] brochure enclosed for review   Joelene Millin L. Lavina Hamman, RN, BSN, [place] Management Care Coordinator Direct Number [phone] Mobile number [phone]  Main [facility] number [phone] Fax number [phone]

## 2018-05-03 ENCOUNTER — Ambulatory Visit: Payer: PPO | Attending: Family Medicine | Admitting: Physical Therapy

## 2018-05-03 ENCOUNTER — Encounter: Payer: Self-pay | Admitting: Physical Therapy

## 2018-05-03 DIAGNOSIS — M25551 Pain in right hip: Secondary | ICD-10-CM

## 2018-05-03 DIAGNOSIS — M25552 Pain in left hip: Secondary | ICD-10-CM | POA: Diagnosis not present

## 2018-05-03 DIAGNOSIS — R2689 Other abnormalities of gait and mobility: Secondary | ICD-10-CM | POA: Diagnosis not present

## 2018-05-03 NOTE — Therapy (Signed)
[facility] [address] [place], [place], [address] Phone: [phone]   Fax:  [phone]  Physical Therapy Evaluation  Patient Details  Name: Jonathan Aguirre MRN: [id number] Date of Birth: [birth year] Referring Provider: Jonna Clark, MD   Encounter Date: 05/03/2018  PT End of Session - 05/03/18 1808    Visit Number  1    Number of Visits  8    Date for PT Re-Evaluation  06/28/18    PT Start Time  6962    PT Stop Time  1446    PT Time Calculation (min)  54 min    Activity Tolerance  Patient tolerated treatment well    Behavior During Therapy  [facility] for tasks assessed/performed       Past Medical History:  Diagnosis Date  . Anxiety and depression   . GERD (gastroesophageal reflux disease)   . Hypertension     Past Surgical History:  Procedure Laterality Date  . COLON SURGERY      There were no vitals filed for this visit.   Subjective Assessment - 05/03/18 1757    Subjective  Pt. is pleasant 47 y.o. male referred to PT for B hip pain.  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 Pt. noted increased pain after canning tomatoes in kitchen where he had made several lateral movements throughout the day.  Pt. noted tenderness and pain the day following.  Pt. noted that pain has been consistent and has not resolved much.  Pt. also currently watches grandchildren 3-4 days out of the week while daughter works at hospital.  Pt. lives active lifestyle in terms of occupation, however he does not exercises in a gym on a regular basis.    Limitations  Sitting;Lifting;Standing;Walking;House hold activities    How long can you sit comfortably?  constantly weight shifts to one side and switches    How long can you stand comfortably?  60 min    How long can you walk comfortably?  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    Currently in Pain?  Yes    Pain Score  4     Pain  Location  Hip    Pain Orientation  Right;Left    Pain Descriptors / Indicators  Aching    Pain Type  Chronic pain    Pain Onset  More than a month ago    Pain Frequency  Constant    Aggravating Factors   Moving laterally, walking for prolonged times, sleeping on side hurts    Pain Relieving Factors  Ibuprofen    Effect of Pain on Daily Activities  Pt. notes difficulty with occupation as a farmer and performing certain movements throughout the day.  Getting in/out of car is difficult.         [facility] PT Assessment - 05/03/18 0001      Assessment   Medical Diagnosis  Left Hip Gluteal Tendinitis    Referring Provider  Jonna Clark, MD    Onset Date/Surgical Date  08/09/17    Hand Dominance  Right    Next MD Visit  08/09/2018    Prior Therapy  Yes      Balance Screen   Has the patient fallen in the past 6 months  Yes    How many times?  1    Has the patient had a decrease in activity level because  of a fear of falling?   No    Is the patient reluctant to leave their home because of a fear of falling?   No      Home Social worker  Private residence    Living Arrangements  Spouse/significant other;Children    Available Help at Discharge  Family    Type of [place] to enter    Entrance Stairs-Number of Steps  3    Entrance Stairs-Rails  Left    [place]  One level      Prior Function   Level of [place]   Chief complaint:  B Hip Pain Onset: Several Months Referring Dx: Gluteal Tendinitis PP:JKDTO Rayala, MD Pain: 4/10 Present, 1/10 Best, 7/10 Worst: Aggravating factors: Moving laterally, walking for prolonged times, sleeping on side hurts Easing factors: Ibuprofen 24 hour pain behavior: Mornings are much better than night.  Pt. Unable to sleep in bed, due to being a side-sleeper and is now sleeping in the recliner. How long can you sit:  How long can you stand: 60 min How long can you  walk: 15 min Recent back trauma: No Prior history of back injury or pain: No Pain quality: pain quality: aching Waking pain: Yes Radiating pain: No  Numbness/Tingling: No Follow-up appointment with MD: No Dominant hand: right Imaging: Yes      OBJECTIVE  Posture Sacral sitting Shifts in seat frequently during history taking.  Gait  Slight trendelenburg on L side in mid-stance phase of gait  Palpation Tightness/ tenderness around greater trochanter and down ITB.  Strength (out of 5) R/L 5/5 Hip flexion 5/5 Hip ER 5/5 Hip IR 5/4 Hip abduction 5/5 Hip adduction 5/5 Hip extension 5/5 Knee extension 5/5 Knee flexion 5/5 Ankle dorsiflexion *Indicates pain     Muscle Length  Ober: R: Positive L: Positive Thomas Test:  R: Positive  L: Positive  SPECIAL TESTS Lumbar quadrant: R: Not examined L: Not examined Slump: R: Not examined L: Not examined SLR: R: Negative L:  Negative Crossed SLR: R: Negative L: Negative FABER: R: Negative L: Negative FADIR: R: Negative L: Negative  Hip scour: R: Negative L: Negative Ely: R: Negative L: Negative Thomas: R: Positive L: Positive Ober: R: Positive L: Positive Forward Step-Down Test: R: Not examined L: Not examined Lateral Step-Down Test: R: Not examined L: Not examined Deep Squat: Not examined    ASSESSMENT  Pt is a pleasant 47 year-old male referred for B hip pain. PT examination reveals deficits in ROM of B hip, pain in B hip, and decreased strength of L hip abduction, specifically during ambulation. Pt. notes increased discomfort mostly with lateral movements and presents with overuse injury bilaterally.  Pt. Demonstrates excellent overall ROM of B hip, however has some tightness/ deficits with lateral LE movements (abduction/adduction specifically).  Pt presents with deficits in strength, mobility, range of motion, and pain. Pt will benefit from skilled PT services to address deficits and return to pain-free function at  home and work on the farm.        See HEP    Plan - 05/03/18 1808    Clinical Impression Statement  Pt is a pleasant 47 year-old male referred for B hip pain. PT examination reveals deficits in ROM of B hip, pain in B hip, and decreased strength of L hip abduction, specifically during ambulation. Pt. notes increased discomfort  mostly with lateral movements and presents with overuse injury bilaterally.  Pt. Demonstrates excellent overall ROM of B hip, however has some tightness/ deficits with lateral LE movements (abduction/adduction specifically).  Pt presents with deficits in strength, mobility, range of motion, and pain. Pt will benefit from skilled PT services to address deficits and return to pain-free function at home and work on the farm.     Clinical Presentation  Stable    Clinical Decision Making  Low    Rehab Potential  Good    PT Frequency  1x / week    PT Duration  8 weeks    PT Treatment/Interventions  Aquatic Therapy;Cryotherapy;English as a second language teacher;Therapeutic activities;Therapeutic exercise;Manual techniques;Functional mobility training;Patient/family education;Neuromuscular re-education;Balance training;Passive range of motion;Dry needling;Taping;Vestibular;Spinal Manipulations;Joint Manipulations    PT Next Visit Plan  Assess HEP and pain levels over the weekend    PT Home Exercise Plan  see handouts.       Patient will benefit from skilled therapeutic intervention in order to improve the following deficits and impairments:  Abnormal gait, Decreased strength, Pain, Difficulty walking, Decreased mobility, Decreased range of motion, Postural dysfunction  Visit Diagnosis: Pain in left hip  Pain in right hip  Other abnormalities of gait and mobility     Problem List Patient Active Problem List   Diagnosis Date Noted  . Shortness of breath 12/14/2016  . Hypertension 12/14/2016  . Chest pain 12/14/2016  . Family  history of premature CAD 12/14/2016  . Morbid obesity ([place]) 12/14/2016  . Anxiety 12/14/2016  . Dizziness 12/14/2016   Pura Spice, PT, DPT # [id number] Gwenlyn Saran, SPT 05/03/2018, 6:24 PM  [place] [facility] [address] [place], [place], [address] Phone: [phone]   Fax:  [phone]  Name: Jonathan Aguirre MRN: [id number] Date of Birth: 03/29/1971

## 2018-05-03 NOTE — Patient Instructions (Signed)
Access Code: [email or web address]  URL: [email or web address]  Date: 05/03/2018  Prepared by: Dorcas Carrow   Exercises  Seated Piriformis Stretch - 3 reps - 1 sets - 20 hold - 2x daily - 7x weekly  Supine Piriformis Stretch with Leg Straight - 3 reps - 1 sets - 2x daily - 7x weekly  Supine ITB Stretch - 3 reps - 1 sets - 2x daily - 7x weekly  Standing ITB Stretch - 3 reps - 1 sets - 2x daily - 7x weekly  Supine Lower Trunk Rotation - 3 reps - 1 sets - 2x daily - 7x weekly  Hooklying Clamshell with Resistance - 20 reps - 1 sets - 1x daily - 7x weekly

## 2018-05-10 ENCOUNTER — Ambulatory Visit: Payer: PPO | Admitting: Physical Therapy

## 2018-05-16 ENCOUNTER — Encounter: Payer: Self-pay | Admitting: Physical Therapy

## 2018-05-16 ENCOUNTER — Ambulatory Visit: Payer: PPO | Attending: Family Medicine | Admitting: Physical Therapy

## 2018-05-16 DIAGNOSIS — R2689 Other abnormalities of gait and mobility: Secondary | ICD-10-CM

## 2018-05-16 DIAGNOSIS — M25552 Pain in left hip: Secondary | ICD-10-CM

## 2018-05-16 DIAGNOSIS — M25551 Pain in right hip: Secondary | ICD-10-CM | POA: Diagnosis not present

## 2018-05-16 NOTE — Therapy (Signed)
[facility] [address]. [place], [place], [address] Phone: [phone]   Fax:  [phone]  Physical Therapy Treatment  Patient Details  Name: Jonathan Aguirre MRN: [id number] Date of Birth: Apr 13, 1971 Referring Provider (PT): Jonna Clark, MD   Encounter Date: 05/16/2018  PT End of Session - 05/16/18 1353    Visit Number  2    Number of Visits  8    Date for PT Re-Evaluation  06/28/18    PT Start Time  5681    PT Stop Time  1433    PT Time Calculation (min)  46 min    Activity Tolerance  Patient tolerated treatment well    Behavior During Therapy  [facility] for tasks assessed/performed       Past Medical History:  Diagnosis Date  . Anxiety and depression   . GERD (gastroesophageal reflux disease)   . Hypertension     Past Surgical History:  Procedure Laterality Date  . COLON SURGERY      There were no vitals filed for this visit.  Subjective Assessment - 05/16/18 1350    Subjective  Pt states that he has no significant changes since his last visit. He did attempt to sleep in the bed last night and that didn't last long 2/2 to pain/discomfort. L hip continues to have more pain than R, but he feels things are improving.    Limitations  Sitting;Lifting;Standing;Walking;House hold activities    How long can you sit comfortably?  constantly weight shifts to one side and switches    How long can you stand comfortably?  60 min    How long can you walk comfortably?  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    Currently in Pain?  Yes    Pain Score  3     Pain Location  Hip    Pain Orientation  Left;Right    Pain Descriptors / Indicators  Aching    Pain Type  Chronic pain    Pain Onset  More than a month ago    Pain Frequency  Constant       TREATMENT  Therapeutic Exercise: BTB, lateral walking x3 laps Nautilus Pallof press B x15, 60#, requires VCs to balance weight  between B LE and prevent L pelvic forward rotation  Patient educated on adaptations for sleeping position and daily activity, as well as balancing L shift in standing.  Manual Therapy: Supine B Distal Hamstring Stretch 4x30 sec each Supine B Proximal Hamstring Stretch 4x30 sec each Supine B Piriformis Stretch 8x30 sec each Supine B Figure-4 Stretch 4x30 sec each Supine B IR/ER Stretch 4x30 sec each Supine L Hip distraction with belt x multiple bouts   PT Education - 05/16/18 1351    Education Details  exercise technique, HEP review    Person(s) Educated  Patient    Methods  Explanation;Demonstration    Comprehension  Verbalized understanding;Returned demonstration          PT Long Term Goals - 05/03/18 1821      PT LONG TERM GOAL #1   Title  Pt. will complete FOTO and improve to 60 to improve daily functional mobility.    Baseline  9/25 FOTO: 51    Time  8    Period  Weeks    Status  New    Target Date  06/28/18      PT  LONG TERM GOAL #2   Title  Pt will decrease worst hip pain as reported on NPRS by at least 2 points in order to demonstrate clinically significant reduction in back pain.     Baseline  9/25 Worst Pain: 7/10    Time  8    Period  Weeks    Status  New    Target Date  06/28/18      PT LONG TERM GOAL #3   Title  Pt will increase strength of L hip by at least 1/2 MMT grade in order to demonstrate improvement in strength and function during ambulation.    Baseline  9/25 Hip Abduction R/L: 5/4    Time  8    Period  Weeks    Status  New    Target Date  06/28/18            Plan - 05/16/18 1353    Clinical Impression Statement Patient presents with reports of decreasing pain. Patient continues to be limited by B hip pain (L>R) pain and decreased strength in B hip abduction and adduction. Patient demonstrates L hip compensations in standing for decreased core strength and motor control. He will continue to benefit from skilled therapeutic intervention to  address the aforementioned deficits, return to his PLOF, and participate fully in farming activities.   Rehab Potential  Good    PT Frequency  1x / week    PT Duration  8 weeks    PT Treatment/Interventions  Aquatic Therapy;Cryotherapy;English as a second language teacher;Therapeutic activities;Therapeutic exercise;Manual techniques;Functional mobility training;Patient/family education;Neuromuscular re-education;Balance training;Passive range of motion;Dry needling;Taping;Vestibular;Spinal Manipulations;Joint Manipulations    PT Next Visit Plan  Assess HEP and pain levels over the weekend    PT Home Exercise Plan  see handouts.       Patient will benefit from skilled therapeutic intervention in order to improve the following deficits and impairments:  Abnormal gait, Decreased strength, Pain, Difficulty walking, Decreased mobility, Decreased range of motion, Postural dysfunction  Visit Diagnosis: Pain in left hip  Pain in right hip  Other abnormalities of gait and mobility     Problem List Patient Active Problem List   Diagnosis Date Noted  . Shortness of breath 12/14/2016  . Hypertension 12/14/2016  . Chest pain 12/14/2016  . Family history of premature CAD 12/14/2016  . Morbid obesity ([place]) 12/14/2016  . Anxiety 12/14/2016  . Dizziness 12/14/2016   Gwenlyn Saran, SPT This entire session was performed under direct supervision and direction of a licensed therapist/therapist assistant . I have personally read, edited and approve of the note as written.  Myles Gip PT, DPT [phone] 05/16/2018, 1:55 PM  [facility] [address] [place], [place], [address] Phone: [phone]   Fax:  [phone]  Name: HUTCHINSON ISENBERG MRN: [id number] Date of Birth: 03/29/1971

## 2018-05-25 ENCOUNTER — Ambulatory Visit: Payer: PPO | Admitting: Physical Therapy

## 2018-05-25 ENCOUNTER — Encounter: Payer: Self-pay | Admitting: Physical Therapy

## 2018-05-25 DIAGNOSIS — M25551 Pain in right hip: Secondary | ICD-10-CM

## 2018-05-25 DIAGNOSIS — M25552 Pain in left hip: Secondary | ICD-10-CM

## 2018-05-25 DIAGNOSIS — R2689 Other abnormalities of gait and mobility: Secondary | ICD-10-CM

## 2018-05-25 NOTE — Therapy (Signed)
[facility] [address]. [place], [place], [address] Phone: [phone]   Fax:  [phone]  Physical Therapy Treatment  Patient Details  Name: Jonathan Aguirre MRN: [id number] Date of Birth: 08-11-1970 Referring Provider (PT): Jonna Clark, MD   Encounter Date: 05/25/2018  PT End of Session - 05/25/18 1410    Visit Number  3    Number of Visits  8    Date for PT Re-Evaluation  06/28/18    PT Start Time  9924    PT Stop Time  1434    PT Time Calculation (min)  51 min    Activity Tolerance  Patient tolerated treatment well    Behavior During Therapy  [facility] for tasks assessed/performed       Past Medical History:  Diagnosis Date  . Anxiety and depression   . GERD (gastroesophageal reflux disease)   . Hypertension     Past Surgical History:  Procedure Laterality Date  . COLON SURGERY      There were no vitals filed for this visit.  Subjective Assessment - 05/25/18 1349    Subjective  Pt. reports that he is feeling much better, however he still has slight discomfort with certain activities.  Pt. notes decreased pain throughout the day and slight increased pain at night during rest.      Limitations  Sitting;Lifting;Standing;Walking;House hold activities    How long can you sit comfortably?  constantly weight shifts to one side and switches    How long can you stand comfortably?  60 min    How long can you walk comfortably?  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    Currently in Pain?  Yes    Pain Score  2     Pain Location  Hip    Pain Orientation  Right;Left    Pain Descriptors / Indicators  Aching    Pain Type  Chronic pain    Pain Onset  More than a month ago       TREATMENT  Patient educated on activity progression and management of symptoms for more independence.  Manual Therapy Supine B Distal Hamstring Stretch 4x30 sec each Supine B Proximal  Hamstring Stretch 4x30 sec each Supine B ITB Stretch 4x30 sec Supine B Piriformis Stretch 8x30 sec each Supine B Figure-4 Stretch 4x30 sec each Supine B IR/ER Stretch 4x30 sec each Supine B ABD 4x30 sec each    PT Long Term Goals - 05/03/18 1821      PT LONG TERM GOAL #1   Title  Pt. will complete FOTO and improve to 60 to improve daily functional mobility.    Baseline  9/25 FOTO: 51    Time  8    Period  Weeks    Status  New    Target Date  06/28/18      PT LONG TERM GOAL #2   Title  Pt will decrease worst hip pain as reported on NPRS by at least 2 points in order to demonstrate clinically significant reduction in back pain.     Baseline  9/25 Worst Pain: 7/10    Time  8    Period  Weeks    Status  New    Target Date  06/28/18      PT LONG TERM GOAL #3   Title  Pt will increase strength of L hip by at least  1/2 MMT grade in order to demonstrate improvement in strength and function during ambulation.    Baseline  9/25 Hip Abduction R/L: 5/4    Time  8    Period  Weeks    Status  New    Target Date  06/28/18            Plan - 05/25/18 1511    Clinical Impression Statement  Patient presents with continued reports of decreasing pain and improved tolerance to activity. Patient is able to do a unilateral carry during farm activity without significant increase in pain, which is a notable improvement from his last visit. 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 Patient will continue to benefit from skilled therapeutic intervention in order to return to his PLOF and transition to independent management.    Rehab Potential  Good    PT Frequency  1x / week    PT Duration  8 weeks    PT Treatment/Interventions  Aquatic  Therapy;Cryotherapy;English as a second language teacher;Therapeutic activities;Therapeutic exercise;Manual techniques;Functional mobility training;Patient/family education;Neuromuscular re-education;Balance training;Passive range of motion;Dry needling;Taping;Vestibular;Spinal Manipulations;Joint Manipulations    PT Next Visit Plan  Reassess pain and management strategies as needed    PT Home Exercise Plan  see handouts.       Patient will benefit from skilled therapeutic intervention in order to improve the following deficits and impairments:  Abnormal gait, Decreased strength, Pain, Difficulty walking, Decreased mobility, Decreased range of motion, Postural dysfunction  Visit Diagnosis: Pain in left hip  Pain in right hip  Other abnormalities of gait and mobility     Problem List Patient Active Problem List   Diagnosis Date Noted  . Shortness of breath 12/14/2016  . Hypertension 12/14/2016  . Chest pain 12/14/2016  . Family history of premature CAD 12/14/2016  . Morbid obesity ([place]) 12/14/2016  . Anxiety 12/14/2016  . Dizziness 12/14/2016    Gwenlyn Saran, SPT  This entire session was performed under direct supervision and direction of a licensed therapist/therapist assistant . I have personally read, edited and approve of the note as written.  Myles Gip PT, DPT [id number] 05/25/2018, 3:19 PM  [facility] [address] [place], [place], [address] Phone: [phone]   Fax:  [phone]  Name: Jonathan Aguirre MRN: [id number] Date of Birth: 1970/11/02

## 2018-06-17 ENCOUNTER — Other Ambulatory Visit: Payer: Self-pay

## 2018-06-17 ENCOUNTER — Emergency Department
Admission: EM | Admit: 2018-06-17 | Discharge: 2018-06-17 | Disposition: A | Discharge status: Home / Self Care | Payer: PPO | Attending: Emergency Medicine | Admitting: Emergency Medicine

## 2018-06-17 ENCOUNTER — Encounter: Payer: Self-pay | Admitting: Emergency Medicine

## 2018-06-17 ENCOUNTER — Ambulatory Visit
Admission: EM | Admit: 2018-06-17 | Discharge: 2018-06-17 | Disposition: A | Discharge status: Home / Self Care | Payer: PPO | Source: Home / Self Care | Attending: Family Medicine | Admitting: Family Medicine

## 2018-06-17 DIAGNOSIS — Z79899 Other long term (current) drug therapy: Secondary | ICD-10-CM | POA: Diagnosis not present

## 2018-06-17 DIAGNOSIS — J029 Acute pharyngitis, unspecified: Secondary | ICD-10-CM

## 2018-06-17 DIAGNOSIS — J069 Acute upper respiratory infection, unspecified: Secondary | ICD-10-CM | POA: Diagnosis not present

## 2018-06-17 DIAGNOSIS — I10 Essential (primary) hypertension: Secondary | ICD-10-CM | POA: Diagnosis not present

## 2018-06-17 DIAGNOSIS — K047 Periapical abscess without sinus: Secondary | ICD-10-CM | POA: Diagnosis not present

## 2018-06-17 DIAGNOSIS — M272 Inflammatory conditions of jaws: Secondary | ICD-10-CM | POA: Diagnosis not present

## 2018-06-17 LAB — CBC WITH DIFFERENTIAL/PLATELET
Abs Immature Granulocytes: 0.03 10*3/uL (ref 0.00–0.07)
Basophils Absolute: 0.1 10*3/uL (ref 0.0–0.1)
Basophils Relative: 1 %
EOS ABS: 0.2 10*3/uL (ref 0.0–0.5)
EOS PCT: 2 %
HEMATOCRIT: 45.9 % (ref 39.0–52.0)
Hemoglobin: 15.5 g/dL (ref 13.0–17.0)
Immature Granulocytes: 0 %
LYMPHS ABS: 1.4 10*3/uL (ref 0.7–4.0)
Lymphocytes Relative: 14 %
MCH: 29.5 pg (ref 26.0–34.0)
MCHC: 33.8 g/dL (ref 30.0–36.0)
MCV: 87.4 fL (ref 80.0–100.0)
MONO ABS: 0.7 10*3/uL (ref 0.1–1.0)
MONOS PCT: 8 %
Neutro Abs: 7.2 10*3/uL (ref 1.7–7.7)
Neutrophils Relative %: 75 %
Platelets: 268 10*3/uL (ref 150–400)
RBC: 5.25 MIL/uL (ref 4.22–5.81)
RDW: 12.5 % (ref 11.5–15.5)
WBC: 9.5 10*3/uL (ref 4.0–10.5)
nRBC: 0 % (ref 0.0–0.2)

## 2018-06-17 LAB — BASIC METABOLIC PANEL
ANION GAP: 9 (ref 5–15)
BUN: 12 mg/dL (ref 6–20)
CALCIUM: 9.4 mg/dL (ref 8.9–10.3)
CO2: 26 mmol/L (ref 22–32)
CREATININE: 1.07 mg/dL (ref 0.61–1.24)
Chloride: 102 mmol/L (ref 98–111)
GFR calc Af Amer: >60 mL/min (ref >60)
GFR calc non Af Amer: >60 mL/min (ref >60)
Glucose, Bld: 88 mg/dL (ref 70–99)
Potassium: 3.9 mmol/L (ref 3.5–5.1)
Sodium: 137 mmol/L (ref 135–145)

## 2018-06-17 LAB — RAPID STREP SCREEN (MED CTR MEBANE ONLY): STREPTOCOCCUS, GROUP A SCREEN (DIRECT): NEGATIVE

## 2018-06-17 MED ORDER — KETOROLAC TROMETHAMINE 30 MG/ML IJ SOLN
30.0000 mg | Freq: Once | INTRAMUSCULAR | Status: AC
  Administered 2018-06-17: 30 mg via INTRAVENOUS
  Filled 2018-06-17: qty 1

## 2018-06-17 MED ORDER — PREDNISOLONE SODIUM PHOSPHATE 15 MG/5ML PO SOLN
30.0000 mg | Freq: Once | ORAL | Status: AC
  Administered 2018-06-17: 30 mg via ORAL
  Filled 2018-06-17: qty 2

## 2018-06-17 MED ORDER — PREDNISONE 5 MG/5ML PO SOLN: 20.0000 mg | Freq: Every day | ORAL | 0 refills | Status: DC

## 2018-06-17 MED ORDER — CLINDAMYCIN PALMITATE HCL 75 MG/5ML PO SOLR: 300.0000 mg | Freq: Three times a day (TID) | ORAL | 0 refills | Status: AC

## 2018-06-17 MED ORDER — OXYCODONE-ACETAMINOPHEN 5-325 MG PO TABS
1.0000 | ORAL_TABLET | Freq: Once | ORAL | Status: AC
Start: 2018-06-17 — End: 2018-06-17
  Administered 2018-06-17: 1 via ORAL
  Filled 2018-06-17: qty 1

## 2018-06-17 MED ORDER — CLINDAMYCIN PHOSPHATE 900 MG/50ML IV SOLN
900.0000 mg | Freq: Once | INTRAVENOUS | Status: AC
  Administered 2018-06-17: 900 mg via INTRAVENOUS
  Filled 2018-06-17: qty 50

## 2018-06-17 MED ORDER — METHYLPREDNISOLONE SODIUM SUCC 125 MG IJ SOLR
125.0000 mg | Freq: Once | INTRAMUSCULAR | Status: AC
  Administered 2018-06-17: 125 mg via INTRAVENOUS
  Filled 2018-06-17: qty 2

## 2018-06-17 NOTE — Discharge Instructions (Signed)
The nearest thing the computer has her discharge instructions as dental abscess.  Please take the clindamycin liquid 300 mg 3 times a day.  Take the prednisone either 20 or 30 mg once a day.  Take the Motrin 800 mg 3 times a day as needed for pain.  Only use the Motrin for 2 or 3 more days.  Too much can affect your kidneys.  You can also give you an ulcer.  Please return tomorrow unless you are much much better.  I would plan on re-dosing you with steroids and clindamycin IV tomorrow.  Return at any time if you are a lot worse.  This includes increasing pain swelling or fever.

## 2018-06-17 NOTE — ED Triage Notes (Signed)
Patient c/o sore throat that started on Tuesday.  Patient denies fevers.

## 2018-06-17 NOTE — ED Provider Notes (Addendum)
[facility]    CSN: [id number] Arrival date & time: 06/17/18  1547     History   Chief Complaint Chief Complaint  Patient presents with  . Sore Throat    HPI Jonathan Aguirre is a 47 y.o. male.   HPI  47 year old male that presents today accompanied by his wife, an Therapist, sports, had a sore throat that started on Tuesday.  He states at first it was painful to swallow and all on the left side.  For the las day or so it has exacerbated to the point where he is unable to open his mouth fully cannot swallow  even though he loves to eat.  No fever or chills to this point.  Had no muffling of his voice.  Has just a "hard spot" in the back of his throat on the left that has been very tender and painful.  Had no breathing difficulties.  His wife also relates that last week he had a large pimple in the inside of his nose that she squeezed a great deal of pus and blood from.       Past Medical History:  Diagnosis Date  . Anxiety and depression   . GERD (gastroesophageal reflux disease)   . Hypertension     Patient Active Problem List   Diagnosis Date Noted  . Shortness of breath 12/14/2016  . Hypertension 12/14/2016  . Chest pain 12/14/2016  . Family history of premature CAD 12/14/2016  . Morbid obesity ([place]) 12/14/2016  . Anxiety 12/14/2016  . Dizziness 12/14/2016    Past Surgical History:  Procedure Laterality Date  . COLON SURGERY         Home Medications    Prior to Admission medications   Medication Sig Start Date End Date Taking? Authorizing Provider  cyclobenzaprine (FLEXERIL) 10 MG tablet Take 10 mg by mouth daily as needed for muscle spasms.   Yes [provider]  lamoTRIgine (LAMICTAL) 100 MG tablet Take 100 mg by mouth daily.   Yes [provider]  metoprolol succinate (TOPROL-XL) 25 MG 24 hr tablet daily. 11/11/17  Yes [provider]  Multiple Vitamin (MULTIVITAMIN) tablet Take 1 tablet by mouth daily.   Yes [provider]  omeprazole (PRILOSEC) 20 MG capsule Take 20 mg by mouth 2 (two) times daily before a meal.   Yes [provider]  rosuvastatin (CRESTOR) 5 MG tablet Take 1 tablet (5 mg total) by mouth daily. 04/18/18  Yes Minna Merritts, MD  traZODone (DESYREL) 100 MG tablet Take 200 mg by mouth at bedtime.   Yes [provider]  ibuprofen (ADVIL,MOTRIN) 600 MG tablet Take 1 tablet (600 mg total) by mouth every 6 (six) hours as needed. 05/24/17   Rudene Re, MD    Family History Family History  Problem Relation Age of Onset  . Heart failure Mother   . Hyperlipidemia Mother   . Hypertension Mother   . Prostate cancer Neg Hx   . Bladder Cancer Neg Hx   . Kidney cancer Neg Hx     Social History Social History   Tobacco Use  . Smoking status: Never Smoker  . Smokeless tobacco: Never Used  Substance Use Topics  . Alcohol use: No  . Drug use: No     Allergies   Patient has no known allergies.   Review of Systems Review of Systems  Constitutional: Positive for activity change and appetite change. Negative for chills, fatigue and fever.  HENT: Positive for  sore throat and trouble swallowing. Negative for voice change.   Respiratory: Negative for cough, choking, shortness of breath and stridor.   All other systems reviewed and are negative.    Physical Exam Triage Vital Signs ED Triage Vitals  Enc Vitals Group     BP 06/17/18 1556 (!) 136/101     Pulse Rate 06/17/18 1556 100     Resp 06/17/18 1556 16     Temp 06/17/18 1556 98.4 F (36.9 C)     Temp Source 06/17/18 1556 Oral     SpO2 06/17/18 1556 98 %     Weight 06/17/18 1554 275 lb (124.7 kg)     Height 06/17/18 1554 6\' 1"  (1.854 m)     Head Circumference --      Peak Flow --      Pain Score 06/17/18 1554 6     Pain Loc --      Pain Edu? --      Excl. in [place]? --    No data found.  Updated Vital Signs BP (!) 136/101 (BP Location: Left Arm)   Pulse 100   Temp 98.4 F (36.9 C)  (Oral)   Resp 16   Ht 6\' 1"  (1.854 m)   Wt 275 lb (124.7 kg)   SpO2 98%   BMI 36.28 kg/m   Visual Acuity Right Eye Distance:   Left Eye Distance:   Bilateral Distance:    Right Eye Near:   Left Eye Near:    Bilateral Near:     Physical Exam  Constitutional: He is oriented to person, place, and time. He appears well-developed and well-nourished.  Non-toxic appearance. He does not appear ill. No distress.  HENT:  Head: Normocephalic.  Right Ear: Hearing, tympanic membrane and ear canal normal.  Left Ear: Hearing, tympanic membrane and ear canal normal.  Mouth/Throat: Oral lesions present. No uvula swelling. Posterior oropharyngeal edema and tonsillar abscesses present. No oropharyngeal exudate. Tonsils are 3+ on the left.  Eyes: Pupils are equal, round, and reactive to light.  Neck: Normal range of motion.  Neurological: He is alert and oriented to person, place, and time.  Skin: Skin is warm and dry.  Psychiatric: He has a normal mood and affect. His behavior is normal.  Nursing note and vitals reviewed.    UC Treatments / Results  Labs (all labs ordered are listed, but only abnormal results are displayed) Labs Reviewed  RAPID STREP SCREEN ([facility] ONLY)  CULTURE, GROUP A STREP [facility])    EKG None  Radiology No results found.  Procedures Procedures (including critical care time)  Medications Ordered in UC Medications - No data to display  Initial Impression / Assessment and Plan / UC Course  I have reviewed the triage vital signs and the nursing notes.  Pertinent labs & imaging results that were available during my care of the patient were reviewed by me and considered in my medical decision making (see chart for details).   I discussed with the wife and the patient concerns for his trismus, difficulty painful swallowing and enlarged peritonsillar area.  We have no imaging today that would help with the diagnosis and I recommended that they go to [facility]  emergency department for further evaluation and care.  Are in agreement.  Left in a privately owned vehicle was transported by his wife.  He left our facility in stable condition.  Contacted Kim the triage nurse to notify her of his departure here and arrival there and  differential.   Final Clinical Impressions(s) / UC Diagnoses   Final diagnoses:  Sore throat   Discharge Instructions   None    ED Prescriptions    None     Controlled Substance Prescriptions [place] Controlled Substance Registry consulted? Not Applicable   Lorin Picket, PA-C 06/17/18 1706    Lorin Picket, PA-C 06/17/18 1736

## 2018-06-17 NOTE — ED Triage Notes (Signed)
Pt to ED via POV from walk in clinic for peri tonsillar abscess. Pt denies ShOB but states that he is having trouble swallowing. Pt is in NAD at this time.

## 2018-06-17 NOTE — ED Notes (Signed)
Pt discussed with Dr. Corky Downs, Dr. Corky Downs in agreement with ordering CBC and Bmp on pt

## 2018-06-17 NOTE — ED Provider Notes (Signed)
[facility] Emergency Department Provider Note   ____________________________________________   First MD Initiated Contact with Patient 06/17/18 1822     (approximate)  I have reviewed the triage vital signs and the nursing notes.   HISTORY  Chief Complaint Abscess  HPI Jonathan Aguirre is a 47 y.o. male patient comes from the walk-in clinic apparently for peritonsillar abscess.  He says he developed a mild sore throat on Tuesday.  This is gotten worse and worse and he went to the walk-in clinic today they did a strep test which was negative.  He has a hard area on the left side of his throat.  He cannot open his mouth fully.  His sore throat is getting worse and radiating to his ear and into his neck.  Her shortness of breath or any other problems today.  He did have a very large pimple in his nose a couple weeks ago and a couple of other infections which have gone away.  His wife is worried because [place] farm and she is wondering if that he has some kind of weird infection.   Past Medical History:  Diagnosis Date  . Anxiety and depression   . GERD (gastroesophageal reflux disease)   . Hypertension     Patient Active Problem List   Diagnosis Date Noted  . Shortness of breath 12/14/2016  . Hypertension 12/14/2016  . Chest pain 12/14/2016  . Family history of premature CAD 12/14/2016  . Morbid obesity ([place]) 12/14/2016  . Anxiety 12/14/2016  . Dizziness 12/14/2016    Past Surgical History:  Procedure Laterality Date  . COLON SURGERY      Prior to Admission medications   Medication Sig Start Date End Date Taking? Authorizing Provider  clindamycin (CLEOCIN) 75 MG/5ML solution Take 20 mLs (300 mg total) by mouth 3 (three) times daily for 10 days. Dispense 10-day supply not 100 mL no refills 06/17/18 06/27/18  Nena Polio, MD  cyclobenzaprine (FLEXERIL) 10 MG tablet Take 10 mg by mouth daily as needed for muscle spasms.    [provider]  ibuprofen (ADVIL,MOTRIN) 600 MG tablet Take 1 tablet (600 mg total) by mouth every 6 (six) hours as needed. 05/24/17   Rudene Re, MD  lamoTRIgine (LAMICTAL) 100 MG tablet Take 100 mg by mouth daily.    [provider]  metoprolol succinate (TOPROL-XL) 25 MG 24 hr tablet daily. 11/11/17   [provider]  Multiple Vitamin (MULTIVITAMIN) tablet Take 1 tablet by mouth daily.    [provider]  omeprazole (PRILOSEC) 20 MG capsule Take 20 mg by mouth 2 (two) times daily before a meal.    [provider]  predniSONE 5 MG/5ML solution Take 20 mLs (20 mg total) by mouth daily with breakfast. Original I prescribed Orapred 30 mg p.o. once a day.  The computer would not accept this.  If the patient can get this instead it would be easier. 06/17/18   Nena Polio, MD  rosuvastatin (CRESTOR) 5 MG tablet Take 1 tablet (5 mg total) by mouth daily. 04/18/18   Minna Merritts, MD  traZODone (DESYREL) 100 MG tablet Take 200 mg by mouth at bedtime.    [provider]    Allergies Patient has no known allergies.  Family History  Problem Relation Age of Onset  . Heart failure Mother   . Hyperlipidemia Mother   . Hypertension Mother   . Prostate cancer Neg Hx   .  Bladder Cancer Neg Hx   . Kidney cancer Neg Hx     Social History Social History   Tobacco Use  . Smoking status: Never Smoker  . Smokeless tobacco: Never Used  Substance Use Topics  . Alcohol use: No  . Drug use: No    Review of Systems  Constitutional: No fever/chills Eyes: No visual changes. ZSW:FUXN throat. Cardiovascular: Denies chest pain. Respiratory: Denies shortness of breath. Gastrointestinal: No abdominal pain.  No nausea, no vomiting.  No diarrhea.  No constipation. Genitourinary: Negative for dysuria. Musculoskeletal: Negative for back pain. Skin: Negative for rash. Neurological: Negative for headaches, focal weakness    ____________________________________________   PHYSICAL EXAM:  VITAL SIGNS: ED Triage Vitals  Enc Vitals Group     BP 06/17/18 1749 (!) 150/107     Pulse Rate 06/17/18 1749 100     Resp 06/17/18 1749 16     Temp 06/17/18 1748 98.2 F (36.8 C)     Temp src --      SpO2 06/17/18 1749 98 %     Weight 06/17/18 1749 275 lb (124.7 kg)     Height 06/17/18 1749 6\' 1"  (1.854 m)     Head Circumference --      Peak Flow --      Pain Score 06/17/18 1747 7     Pain Loc --      Pain Edu? --      Excl. in [place]? --     Constitutional: Alert and oriented. Well appearing and in no acute distress. Eyes: Conjunctivae are normal. Head: Atraumatic. Nose: No congestion/rhinnorhea. Mouth/Throat: Mucous membranes are moist.  Oropharynx non-erythematous.  There is some swelling just posterior to the molar in the left lower part of the jaw.  This is not in the peritonsillar fossa.  It is just above and posterior to the molar in line of the mandible.  There is no pointing abscess. Neck: No stridor.  Normal voice.  No lymphadenopathy in the neck.  There is a little bit of fullness and slightly tender just under the jaw at the TMJ area.  Cardiovascular: Normal rate, regular rhythm. Grossly normal heart sounds.  Good peripheral circulation. Respiratory: Normal respiratory effort.  No retractions. Lungs CTAB. Gastrointestinal: Soft and nontender. No distention. No abdominal bruits. No CVA tenderness. Musculoskeletal: No lower extremity tenderness nor edema.  Neurologic:  Normal speech and language. No gross focal neurologic deficits are appreciated.  Skin:  Skin is warm, dry and intact. No rash noted. Psychiatric: Mood and affect are normal. Speech and behavior are normal.  ____________________________________________   LABS (all labs ordered are listed, but only abnormal results are displayed)  Labs Reviewed  CBC WITH DIFFERENTIAL/PLATELET  BASIC METABOLIC PANEL    ____________________________________________  EKG   ____________________________________________  RADIOLOGY  ED MD interpretation:  Official radiology report(s): No results found.  ____________________________________________   PROCEDURES  Procedure(s) performed:   Procedures  Critical Care performed:   ____________________________________________   INITIAL IMPRESSION / ASSESSMENT AND PLAN / ED COURSE  We will give this patient some IV clindamycin and Solu-Medrol and Toradol to see if he feels somewhat better.  If it does we will anticipate p.o.'s steroids and clindamycin follow-up tomorrow.  ----------------------------------------- 9:09 PM on 06/17/2018 -----------------------------------------  Patient feels a good bit better.  He looks somewhat better.  Will have him come back tomorrow unless he is even better than that.  In the meantime we will have him continue p.o. clindamycin and prednisone.  I prescribed  him liquid.  I have him take p.o. liquid Motrin as well.  This will all be easier to swallow since it does hurt to swallow somewhat.    Clinical Course as of Jun 17 2108  Sat Jun 17, 2018  2055 RDW: 12.5 [PM]    Clinical Course User Index [PM] Nena Polio, MD     ____________________________________________   FINAL CLINICAL IMPRESSION(S) / ED DIAGNOSES  Final diagnoses:  Dental abscess  Actually abscess along the mandible.   ED Discharge Orders         Ordered    predniSONE 5 MG/5ML solution  Daily with breakfast     06/17/18 2105    clindamycin (CLEOCIN) 75 MG/5ML solution  3 times daily     06/17/18 2105           Note:  This document was prepared using Dragon voice recognition software and may include unintentional dictation errors.    Nena Polio, MD 06/17/18 2110

## 2018-06-20 LAB — CULTURE, GROUP A STREP (THRC)

## 2018-10-03 DIAGNOSIS — F411 Generalized anxiety disorder: Secondary | ICD-10-CM | POA: Diagnosis not present

## 2018-10-03 DIAGNOSIS — F4001 Agoraphobia with panic disorder: Secondary | ICD-10-CM | POA: Diagnosis not present

## 2018-12-13 DIAGNOSIS — F4001 Agoraphobia with panic disorder: Secondary | ICD-10-CM | POA: Diagnosis not present

## 2018-12-13 DIAGNOSIS — F411 Generalized anxiety disorder: Secondary | ICD-10-CM | POA: Diagnosis not present

## 2019-02-04 ENCOUNTER — Emergency Department
Admission: EM | Admit: 2019-02-04 | Discharge: 2019-02-04 | Disposition: A | Discharge status: Home / Self Care | Payer: PPO | Attending: Emergency Medicine | Admitting: Emergency Medicine

## 2019-02-04 ENCOUNTER — Encounter: Payer: Self-pay | Admitting: Emergency Medicine

## 2019-02-04 ENCOUNTER — Other Ambulatory Visit: Payer: Self-pay

## 2019-02-04 ENCOUNTER — Emergency Department: Payer: PPO

## 2019-02-04 DIAGNOSIS — I10 Essential (primary) hypertension: Secondary | ICD-10-CM | POA: Insufficient documentation

## 2019-02-04 DIAGNOSIS — Z9049 Acquired absence of other specified parts of digestive tract: Secondary | ICD-10-CM | POA: Diagnosis not present

## 2019-02-04 DIAGNOSIS — Z79899 Other long term (current) drug therapy: Secondary | ICD-10-CM | POA: Insufficient documentation

## 2019-02-04 DIAGNOSIS — A044 Other intestinal Escherichia coli infections: Secondary | ICD-10-CM

## 2019-02-04 DIAGNOSIS — R109 Unspecified abdominal pain: Secondary | ICD-10-CM | POA: Diagnosis present

## 2019-02-04 DIAGNOSIS — K219 Gastro-esophageal reflux disease without esophagitis: Secondary | ICD-10-CM | POA: Insufficient documentation

## 2019-02-04 DIAGNOSIS — A042 Enteroinvasive Escherichia coli infection: Secondary | ICD-10-CM | POA: Insufficient documentation

## 2019-02-04 DIAGNOSIS — R197 Diarrhea, unspecified: Secondary | ICD-10-CM | POA: Diagnosis not present

## 2019-02-04 DIAGNOSIS — A045 Campylobacter enteritis: Secondary | ICD-10-CM | POA: Diagnosis not present

## 2019-02-04 LAB — URINALYSIS, COMPLETE (UACMP) WITH MICROSCOPIC
Bacteria, UA: NONE SEEN
Bilirubin Urine: NEGATIVE
Glucose, UA: NEGATIVE mg/dL
Hgb urine dipstick: NEGATIVE
Ketones, ur: NEGATIVE mg/dL
Leukocytes,Ua: NEGATIVE
Nitrite: NEGATIVE
Protein, ur: NEGATIVE mg/dL
Specific Gravity, Urine: 1.011 (ref 1.005–1.030)
Squamous Epithelial / LPF: NONE SEEN (ref 0–5)
pH: 6 (ref 5.0–8.0)

## 2019-02-04 LAB — GASTROINTESTINAL PANEL BY PCR, STOOL (REPLACES STOOL CULTURE)

## 2019-02-04 LAB — COMPREHENSIVE METABOLIC PANEL
ALT: 12 U/L (ref 0–44)
AST: 18 U/L (ref 15–41)
Albumin: 4.1 g/dL (ref 3.5–5.0)
Alkaline Phosphatase: 47 U/L (ref 38–126)
Anion gap: 11 (ref 5–15)
BUN: 10 mg/dL (ref 6–20)
CO2: 22 mmol/L (ref 22–32)
Calcium: 9.1 mg/dL (ref 8.9–10.3)
Chloride: 104 mmol/L (ref 98–111)
Creatinine, Ser: 0.85 mg/dL (ref 0.61–1.24)
GFR calc Af Amer: >60 mL/min (ref >60)
GFR calc non Af Amer: >60 mL/min (ref >60)
Glucose, Bld: 112 mg/dL — ABNORMAL HIGH (ref 70–99)
Potassium: 4.1 mmol/L (ref 3.5–5.1)
Sodium: 137 mmol/L (ref 135–145)
Total Bilirubin: 1.1 mg/dL (ref 0.3–1.2)
Total Protein: 7.6 g/dL (ref 6.5–8.1)

## 2019-02-04 LAB — C DIFFICILE QUICK SCREEN W PCR REFLEX
C Diff antigen: NEGATIVE
C Diff interpretation: NOT DETECTED
C Diff toxin: NEGATIVE

## 2019-02-04 LAB — CBC
HCT: 48.8 % (ref 39.0–52.0)
Hemoglobin: 16.8 g/dL (ref 13.0–17.0)
MCH: 29.6 pg (ref 26.0–34.0)
MCHC: 34.4 g/dL (ref 30.0–36.0)
MCV: 86.1 fL (ref 80.0–100.0)
Platelets: 227 10*3/uL (ref 150–400)
RBC: 5.67 MIL/uL (ref 4.22–5.81)
RDW: 12.2 % (ref 11.5–15.5)
WBC: 7.3 10*3/uL (ref 4.0–10.5)
nRBC: 0 % (ref 0.0–0.2)

## 2019-02-04 LAB — LIPASE, BLOOD: Lipase: 27 U/L (ref 11–51)

## 2019-02-04 LAB — LACTIC ACID, PLASMA: Lactic Acid, Venous: 1.1 mmol/L (ref 0.5–1.9)

## 2019-02-04 MED ORDER — HYDROCODONE-ACETAMINOPHEN 5-325 MG PO TABS: 1.0000 | ORAL_TABLET | ORAL | 0 refills | Status: AC | PRN

## 2019-02-04 MED ORDER — CIPROFLOXACIN HCL 500 MG PO TABS: 500.0000 mg | ORAL_TABLET | Freq: Two times a day (BID) | ORAL | 0 refills | Status: AC

## 2019-02-04 MED ORDER — SODIUM CHLORIDE 0.9 % IV SOLN
2.0000 g | Freq: Once | INTRAVENOUS | Status: AC
  Administered 2019-02-04: 2 g via INTRAVENOUS
  Filled 2019-02-04: qty 20

## 2019-02-04 MED ORDER — IOHEXOL 300 MG/ML  SOLN
100.0000 mL | Freq: Once | INTRAMUSCULAR | Status: AC | PRN
  Administered 2019-02-04: 100 mL via INTRAVENOUS

## 2019-02-04 MED ORDER — MORPHINE SULFATE (PF) 4 MG/ML IV SOLN
6.0000 mg | Freq: Once | INTRAVENOUS | Status: AC
  Administered 2019-02-04: 6 mg via INTRAVENOUS
  Filled 2019-02-04: qty 2

## 2019-02-04 MED ORDER — SODIUM CHLORIDE 0.9 % IV BOLUS
1000.0000 mL | Freq: Once | INTRAVENOUS | Status: AC
  Administered 2019-02-04: 1000 mL via INTRAVENOUS

## 2019-02-04 MED ORDER — MORPHINE SULFATE (PF) 4 MG/ML IV SOLN
4.0000 mg | Freq: Once | INTRAVENOUS | Status: AC
  Administered 2019-02-04: 4 mg via INTRAVENOUS
  Filled 2019-02-04: qty 1

## 2019-02-04 MED ORDER — SODIUM CHLORIDE 0.9% FLUSH: 3.0000 mL | Freq: Once | INTRAVENOUS | Status: DC

## 2019-02-04 MED ORDER — SODIUM CHLORIDE 0.9 % IV BOLUS
1000.0000 mL | Freq: Once | INTRAVENOUS | Status: AC
  Administered 2019-02-04: 14:00:00 1000 mL via INTRAVENOUS

## 2019-02-04 MED ORDER — DICYCLOMINE HCL 10 MG PO CAPS
20.0000 mg | ORAL_CAPSULE | Freq: Once | ORAL | Status: AC
  Administered 2019-02-04: 20 mg via ORAL
  Filled 2019-02-04: qty 2

## 2019-02-04 MED ORDER — DICYCLOMINE HCL 20 MG PO TABS: 20.0000 mg | ORAL_TABLET | Freq: Three times a day (TID) | ORAL | 0 refills | Status: DC

## 2019-02-04 MED ORDER — ONDANSETRON HCL 4 MG/2ML IJ SOLN
4.0000 mg | Freq: Once | INTRAMUSCULAR | Status: AC
  Administered 2019-02-04: 4 mg via INTRAVENOUS
  Filled 2019-02-04: qty 2

## 2019-02-04 MED ORDER — METRONIDAZOLE IN NACL 5-0.79 MG/ML-% IV SOLN
500.0000 mg | Freq: Once | INTRAVENOUS | Status: AC
  Administered 2019-02-04: 500 mg via INTRAVENOUS
  Filled 2019-02-04: qty 100

## 2019-02-04 MED ORDER — KETOROLAC TROMETHAMINE 30 MG/ML IJ SOLN
15.0000 mg | Freq: Once | INTRAMUSCULAR | Status: AC
  Administered 2019-02-04: 15 mg via INTRAVENOUS
  Filled 2019-02-04: qty 1

## 2019-02-04 MED ORDER — ONDANSETRON 4 MG PO TBDP: 4.0000 mg | ORAL_TABLET | Freq: Three times a day (TID) | ORAL | 0 refills | Status: DC | PRN

## 2019-02-04 NOTE — ED Notes (Signed)
Patient transported to CT 

## 2019-02-04 NOTE — ED Notes (Signed)
Patient provided with 2 cups water, tolerated well

## 2019-02-04 NOTE — ED Triage Notes (Signed)
Pt reports that he started feeling bad Thursday night but was visitng his daughter. Pt had 8 episodes of diarrhea last pm and passed out Friday night. Pt states he nearly passed out again last pm. Wife reports sitting bp of 947 systolic, standing bp 07/61, heart rate of 115, pt reports lower right abd pain

## 2019-02-04 NOTE — ED Provider Notes (Signed)
[facility] Emergency Department Provider Note  ____________________________________________   First MD Initiated Contact with Patient 02/04/19 1552     (approximate)  I have reviewed the triage vital signs and the nursing notes.   HISTORY  Chief Complaint Abdominal Pain and Diarrhea    HPI Jonathan Aguirre is a 48 y.o. male here with generalized weakness, nausea, and diarrhea.  The patient states that for the last 4 to 5 days, he has had persistently worsening, watery, nonbloody diarrhea.  Symptoms started as diffuse abdominal cramping and diarrhea, now followed by lightheadedness every time he stands up and multiple episodes in which she almost passed out after standing.  He has had nausea but no vomiting.  He had a fever as well with the onset of symptoms, but has not had a fever since then.  He does have a history of diverticulitis requiring colonic resection, but denies issues since then.  He has been passing flatus.  No vomiting is mention.  Pain is worse with palpation and movement, as well as eating.  No alleviating factors.  No sick contacts.  No suspicious food intake.        Past Medical History:  Diagnosis Date  . Anxiety and depression   . GERD (gastroesophageal reflux disease)   . Hypertension     Patient Active Problem List   Diagnosis Date Noted  . Shortness of breath 12/14/2016  . Hypertension 12/14/2016  . Chest pain 12/14/2016  . Family history of premature CAD 12/14/2016  . Morbid obesity ([place]) 12/14/2016  . Anxiety 12/14/2016  . Dizziness 12/14/2016    Past Surgical History:  Procedure Laterality Date  . COLON SURGERY      Prior to Admission medications   Medication Sig Start Date End Date Taking? Authorizing Provider  ciprofloxacin (CIPRO) 500 MG tablet Take 1 tablet (500 mg total) by mouth 2 (two) times daily for 10 days. 02/04/19 02/14/19  Duffy Bruce, MD  cyclobenzaprine (FLEXERIL) 10 MG tablet Take 10 mg by mouth  daily as needed for muscle spasms.    [provider]  dicyclomine (BENTYL) 20 MG tablet Take 1 tablet (20 mg total) by mouth 4 (four) times daily -  before meals and at bedtime for 5 days. 02/04/19 02/09/19  Duffy Bruce, MD  HYDROcodone-acetaminophen (NORCO/VICODIN) 5-325 MG tablet Take 1-2 tablets by mouth every 4 (four) hours as needed for moderate pain or severe pain. 02/04/19 02/04/20  Duffy Bruce, MD  ibuprofen (ADVIL,MOTRIN) 600 MG tablet Take 1 tablet (600 mg total) by mouth every 6 (six) hours as needed. 05/24/17   Rudene Re, MD  lamoTRIgine (LAMICTAL) 100 MG tablet Take 100 mg by mouth daily.    [provider]  metoprolol succinate (TOPROL-XL) 25 MG 24 hr tablet daily. 11/11/17   [provider]  Multiple Vitamin (MULTIVITAMIN) tablet Take 1 tablet by mouth daily.    [provider]  omeprazole (PRILOSEC) 20 MG capsule Take 20 mg by mouth 2 (two) times daily before a meal.    [provider]  ondansetron (ZOFRAN ODT) 4 MG disintegrating tablet Take 1 tablet (4 mg total) by mouth every 8 (eight) hours as needed for nausea or vomiting. 02/04/19   Duffy Bruce, MD  predniSONE 5 MG/5ML solution Take 20 mLs (20 mg total) by mouth daily with breakfast. Original I prescribed Orapred 30 mg p.o. once a day.  The computer would not accept this.  If the patient can get this instead it would be  easier. 06/17/18   Nena Polio, MD  rosuvastatin (CRESTOR) 5 MG tablet Take 1 tablet (5 mg total) by mouth daily. 04/18/18   Minna Merritts, MD  traZODone (DESYREL) 100 MG tablet Take 200 mg by mouth at bedtime.    [provider]    Allergies Patient has no known allergies.  Family History  Problem Relation Age of Onset  . Heart failure Mother   . Hyperlipidemia Mother   . Hypertension Mother   . Prostate cancer Neg Hx   . Bladder Cancer Neg Hx   . Kidney cancer Neg Hx     Social History Social History   Tobacco Use  .  Smoking status: Never Smoker  . Smokeless tobacco: Former Network engineer Use Topics  . Alcohol use: No  . Drug use: No    Review of Systems  Review of Systems  Constitutional: Positive for fatigue. Negative for chills and fever.  HENT: Negative for sore throat.   Respiratory: Negative for shortness of breath.   Cardiovascular: Negative for chest pain.  Gastrointestinal: Positive for abdominal pain, diarrhea and nausea.  Genitourinary: Negative for flank pain.  Musculoskeletal: Negative for neck pain.  Skin: Negative for rash and wound.  Allergic/Immunologic: Negative for immunocompromised state.  Neurological: Positive for syncope, weakness and light-headedness. Negative for numbness.  Hematological: Does not bruise/bleed easily.  All other systems reviewed and are negative.    ____________________________________________  PHYSICAL EXAM:      VITAL SIGNS: ED Triage Vitals  Enc Vitals Group     BP 02/04/19 1222 (!) 148/94     Pulse Rate 02/04/19 1222 (!) 125     Resp 02/04/19 1222 18     Temp 02/04/19 1222 98.6 F (37 C)     Temp Source 02/04/19 1222 Oral     SpO2 02/04/19 1222 95 %     Weight 02/04/19 1225 235 lb (106.6 kg)     Height 02/04/19 1225 6\' 1"  (1.854 m)     Head Circumference --      Peak Flow --      Pain Score 02/04/19 1224 8     Pain Loc --      Pain Edu? --      Excl. in [place]? --      Physical Exam Vitals signs and nursing note reviewed.  Constitutional:      General: He is not in acute distress.    Appearance: He is well-developed.  HENT:     Head: Normocephalic and atraumatic.     Comments: Dry MM Eyes:     Conjunctiva/sclera: Conjunctivae normal.  Neck:     Musculoskeletal: Neck supple.  Cardiovascular:     Rate and Rhythm: Normal rate and regular rhythm.     Heart sounds: Normal heart sounds. No murmur. No friction rub.  Pulmonary:     Effort: Pulmonary effort is normal. No respiratory distress.     Breath sounds: Normal breath  sounds. No wheezing or rales.  Abdominal:     General: Bowel sounds are normal. There is no distension.     Palpations: Abdomen is soft.     Tenderness: There is abdominal tenderness in the suprapubic area, left upper quadrant and left lower quadrant. There is no guarding or rebound.  Skin:    General: Skin is warm.     Capillary Refill: Capillary refill takes less than 2 seconds.  Neurological:     Mental Status: He is alert and oriented to person,  place, and time.     Motor: No abnormal muscle tone.       ____________________________________________   LABS (all labs ordered are listed, but only abnormal results are displayed)  Labs Reviewed  GASTROINTESTINAL PANEL BY PCR, STOOL (REPLACES STOOL CULTURE) - Abnormal; Notable for the following components:      Result Value   Campylobacter species DETECTED (*)    Enteropathogenic E coli (EPEC) DETECTED (*)    All other components within normal limits  COMPREHENSIVE METABOLIC PANEL - Abnormal; Notable for the following components:   Glucose, Bld 112 (*)    All other components within normal limits  URINALYSIS, COMPLETE (UACMP) WITH MICROSCOPIC - Abnormal; Notable for the following components:   Color, Urine YELLOW (*)    APPearance CLEAR (*)    All other components within normal limits  C DIFFICILE QUICK SCREEN W PCR REFLEX  LIPASE, BLOOD  CBC  LACTIC ACID, PLASMA    ____________________________________________  EKG: None ________________________________________  RADIOLOGY All imaging, including plain films, CT scans, and ultrasounds, independently reviewed by me, and interpretations confirmed via formal radiology reads.  ED MD interpretation:   CT: reviewed, c/w diffuse enterocolitis, no obstruction, appendix normal  Official radiology report(s): Ct Abdomen Pelvis W Contrast  Result Date: 02/04/2019 CLINICAL DATA:  Right lower quadrant abdominal pain, diarrhea, band ascites suspected EXAM: CT ABDOMEN AND PELVIS  WITH CONTRAST TECHNIQUE: Multidetector CT imaging of the abdomen and pelvis was performed using the standard protocol following bolus administration of intravenous contrast. CONTRAST:  180mL OMNIPAQUE IOHEXOL 300 MG/ML  SOLN COMPARISON:  07/10/2017 FINDINGS: Lower chest: No acute abnormality. Hepatobiliary: No solid liver abnormality is seen. No gallstones, gallbladder wall thickening, or biliary dilatation. Pancreas: Unremarkable. No pancreatic ductal dilatation or surrounding inflammatory changes. Spleen: Normal in size without significant abnormality. Adrenals/Urinary Tract: Adrenal glands are unremarkable. Nonobstructive inferior pole left nephrolithiasis. No hydronephrosis. Bladder is unremarkable. Stomach/Bowel: Stomach is within normal limits. Appendix appears normal. There is evidence of prior partial colon resection. There are numerous loops of thickened, inflamed appearing small-bowel as well as the descending and sigmoid colon about the anastomosis (series 2, image 68, 76, series 5, image 43). Vascular/Lymphatic: No significant vascular findings are present. No enlarged abdominal or pelvic lymph nodes. Reproductive: No mass or other significant abnormality. Other: No abdominal wall hernia or abnormality. No abdominopelvic ascites. Musculoskeletal: No acute or significant osseous findings. IMPRESSION: There is evidence of prior partial colon resection. There are numerous loops of thickened, inflamed appearing small-bowel as well as the descending and sigmoid colon about the anastomosis (series 2, image 68, 76). Findings are consistent with nonspecific infectious, inflammatory, or ischemic enterocolitis. The appendix is normal. Electronically Signed   By: Eddie Candle M.D.   On: 02/04/2019 16:49    ____________________________________________  PROCEDURES   Procedure(s) performed (including Critical Care):  Procedures  ____________________________________________  INITIAL IMPRESSION / MDM /  [place] / ED COURSE  As part of my medical decision making, I reviewed the following data within the electronic MEDICAL RECORD NUMBER Notes from prior ED visits and [place] Controlled Substance Database      *TAREZ BOWNS was evaluated in Emergency Department on 02/04/2019 for the symptoms described in the history of present illness. He was evaluated in the context of the global COVID-19 pandemic, which necessitated consideration that the patient might be at risk for infection with the SARS-CoV-2 virus that causes COVID-19. Institutional protocols and algorithms that pertain to the evaluation of patients at risk for COVID-19  are in a state of rapid change based on information released by regulatory bodies including the CDC and federal and state organizations. These policies and algorithms were followed during the patient's care in the ED.  Some ED evaluations and interventions may be delayed as a result of limited staffing during the pandemic.*   Clinical Course as of Feb 03 2101  Sun Jun 28, 403  1430 48 year old male here with persistent diarrhea and likely orthostatic syncope.  He appears dehydrated clinically.  Lab work is actually overall reassuring with normal renal function, white count, and unremarkable urinalysis.  Given his significant tenderness and history of complicated diverticulitis, CT scan obtained and shows diffuse enterocolitis without acute complication.  Will send a GI pathogen panel,'s plan to start empiric antibiotics given his history, and p.o. challenge.  If symptoms improved after IV fluids, suspect he can be managed as an outpatient.   [CI]    Clinical Course User Index [CI] Duffy Bruce, MD    Medical Decision Making: 48 year old male here with diffuse abdominal pain.  Lab work is overall reassuring, but given his tenderness, CT scan obtained is consistent with diffuse enterocolitis.  GI panel sent, and is positive for Campylobacter as well as Z-Pak.  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  Given combination of an true pathogenic E. coli and Campylobacter, will treat with ciprofloxacin for both.  Azithromycin reportedly not enough for Campylobacter.  Patient updated and in agreement.  Avoid antidiarrheals, supportive care given.  ____________________________________________  FINAL CLINICAL IMPRESSION(S) / ED DIAGNOSES  Final diagnoses:  Campylobacter diarrhea  E coli enteritis     MEDICATIONS GIVEN DURING THIS VISIT:  Medications  sodium chloride flush (NS) 0.9 % injection 3 mL (has no administration in time range)  sodium chloride 0.9 % bolus 1,000 mL (0 mLs Intravenous Stopped 02/04/19 1612)  sodium chloride 0.9 % bolus 1,000 mL (0 mLs Intravenous Stopped 02/04/19 1752)  ondansetron (ZOFRAN) injection 4 mg (4 mg Intravenous Given 02/04/19 1623)  morphine 4 MG/ML injection 6 mg (6 mg Intravenous Given 02/04/19 1623)  iohexol (OMNIPAQUE) 300 MG/ML solution 100 mL (100 mLs Intravenous Contrast Given 02/04/19 1636)  cefTRIAXone (ROCEPHIN) 2 g in sodium chloride 0.9 % 100 mL IVPB (0 g Intravenous Stopped 02/04/19 1818)  metroNIDAZOLE (FLAGYL) IVPB 500 mg (0 mg Intravenous Stopped 02/04/19 1934)  dicyclomine (BENTYL) capsule 20 mg (20 mg Oral Given 02/04/19 1740)  ketorolac (TORADOL) 30 MG/ML injection 15 mg (15 mg Intravenous Given 02/04/19 1743)  morphine 4 MG/ML injection 4 mg (4 mg Intravenous Given 02/04/19 1825)     ED Discharge Orders         Ordered    HYDROcodone-acetaminophen (NORCO/VICODIN) 5-325 MG tablet  Every 4 hours PRN     02/04/19 2038    ciprofloxacin (CIPRO) 500 MG tablet  2 times daily     02/04/19 2038    ondansetron (ZOFRAN ODT) 4 MG disintegrating tablet  Every 8 hours PRN     02/04/19 2038    dicyclomine (BENTYL) 20 MG tablet  3 times daily before meals & bedtime     02/04/19 2038           Note:  This document was prepared using Dragon voice recognition software and may  include unintentional dictation errors.   Duffy Bruce, MD 02/04/19 2102

## 2019-02-13 DIAGNOSIS — F4001 Agoraphobia with panic disorder: Secondary | ICD-10-CM | POA: Diagnosis not present

## 2019-02-13 DIAGNOSIS — F411 Generalized anxiety disorder: Secondary | ICD-10-CM | POA: Diagnosis not present

## 2019-05-16 DIAGNOSIS — Z1211 Encounter for screening for malignant neoplasm of colon: Secondary | ICD-10-CM | POA: Diagnosis not present

## 2019-05-16 DIAGNOSIS — Z0000 Encounter for general adult medical examination without abnormal findings: Secondary | ICD-10-CM | POA: Diagnosis not present

## 2019-05-16 DIAGNOSIS — I10 Essential (primary) hypertension: Secondary | ICD-10-CM | POA: Diagnosis not present

## 2019-05-16 DIAGNOSIS — E785 Hyperlipidemia, unspecified: Secondary | ICD-10-CM | POA: Diagnosis not present

## 2019-05-16 DIAGNOSIS — K219 Gastro-esophageal reflux disease without esophagitis: Secondary | ICD-10-CM | POA: Diagnosis not present

## 2019-06-06 DIAGNOSIS — K219 Gastro-esophageal reflux disease without esophagitis: Secondary | ICD-10-CM | POA: Diagnosis not present

## 2019-06-06 DIAGNOSIS — R1032 Left lower quadrant pain: Secondary | ICD-10-CM | POA: Diagnosis not present

## 2019-06-06 DIAGNOSIS — Z9049 Acquired absence of other specified parts of digestive tract: Secondary | ICD-10-CM | POA: Diagnosis not present

## 2019-06-06 DIAGNOSIS — E669 Obesity, unspecified: Secondary | ICD-10-CM | POA: Diagnosis not present

## 2019-06-06 DIAGNOSIS — Z87442 Personal history of urinary calculi: Secondary | ICD-10-CM | POA: Diagnosis not present

## 2019-06-06 DIAGNOSIS — F419 Anxiety disorder, unspecified: Secondary | ICD-10-CM | POA: Diagnosis not present

## 2019-06-06 DIAGNOSIS — R0781 Pleurodynia: Secondary | ICD-10-CM | POA: Diagnosis not present

## 2019-06-06 DIAGNOSIS — R10814 Left lower quadrant abdominal tenderness: Secondary | ICD-10-CM | POA: Diagnosis not present

## 2019-06-06 DIAGNOSIS — I341 Nonrheumatic mitral (valve) prolapse: Secondary | ICD-10-CM | POA: Diagnosis not present

## 2019-06-06 DIAGNOSIS — K5730 Diverticulosis of large intestine without perforation or abscess without bleeding: Secondary | ICD-10-CM | POA: Diagnosis not present

## 2019-06-06 DIAGNOSIS — Z79899 Other long term (current) drug therapy: Secondary | ICD-10-CM | POA: Diagnosis not present

## 2019-06-06 DIAGNOSIS — R10812 Left upper quadrant abdominal tenderness: Secondary | ICD-10-CM | POA: Diagnosis not present

## 2019-06-06 DIAGNOSIS — F329 Major depressive disorder, single episode, unspecified: Secondary | ICD-10-CM | POA: Diagnosis not present

## 2019-06-06 DIAGNOSIS — I10 Essential (primary) hypertension: Secondary | ICD-10-CM | POA: Diagnosis not present

## 2019-06-12 DIAGNOSIS — W19XXXD Unspecified fall, subsequent encounter: Secondary | ICD-10-CM | POA: Diagnosis not present

## 2019-06-12 DIAGNOSIS — S20212D Contusion of left front wall of thorax, subsequent encounter: Secondary | ICD-10-CM | POA: Diagnosis not present

## 2019-07-24 IMAGING — US US SCROTUM W/ DOPPLER COMPLETE
1 series · 14 of 25 positions shown · non-contrast
Comparison: None.

CLINICAL DATA: Right testicular swelling and pain for the past 3
hours.

EXAM:
SCROTAL ULTRASOUND
DOPPLER ULTRASOUND OF THE TESTICLES
TECHNIQUE: Complete ultrasound examination of the testicles, epididymis, and
other scrotal structures was performed. Color and spectral Doppler
ultrasound were also utilized to evaluate blood flow to the
testicles.

[Series 1: us scrotum w/ doppler complete · 0.08mm/px · 14 of 57 slices shown]
[im 1/57]
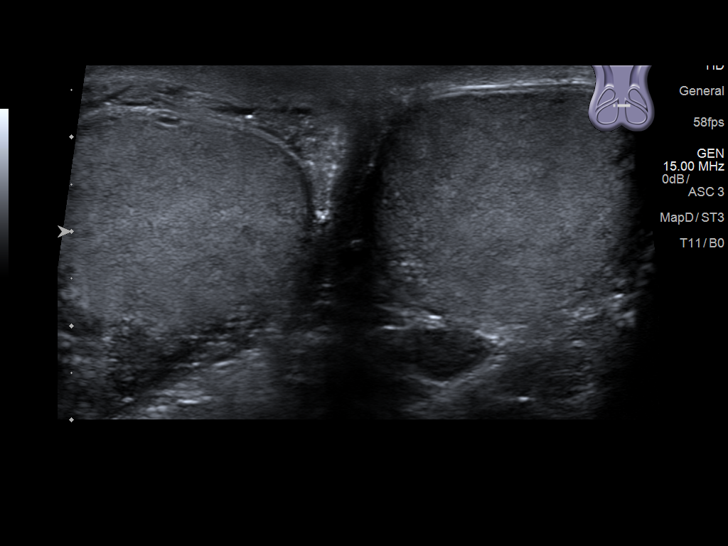
[im 5/57]
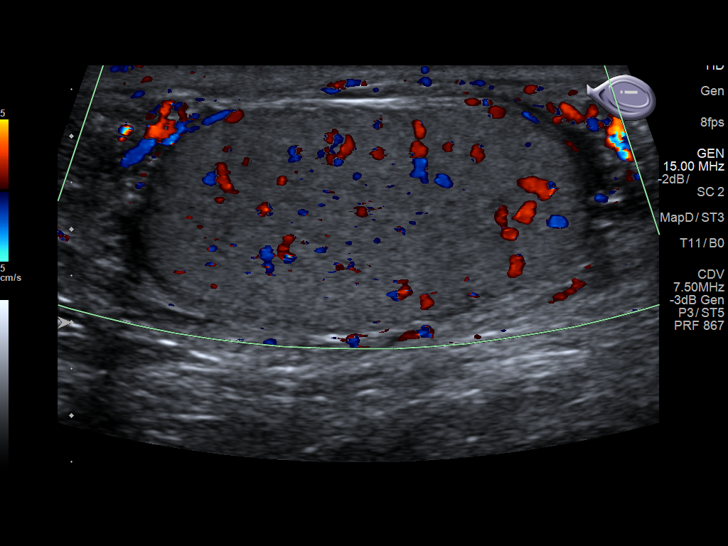
[im 10/57]
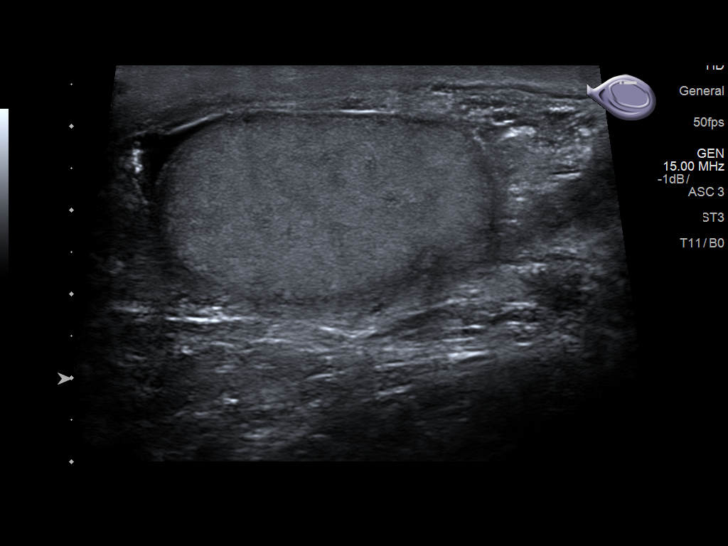
[im 15/57]
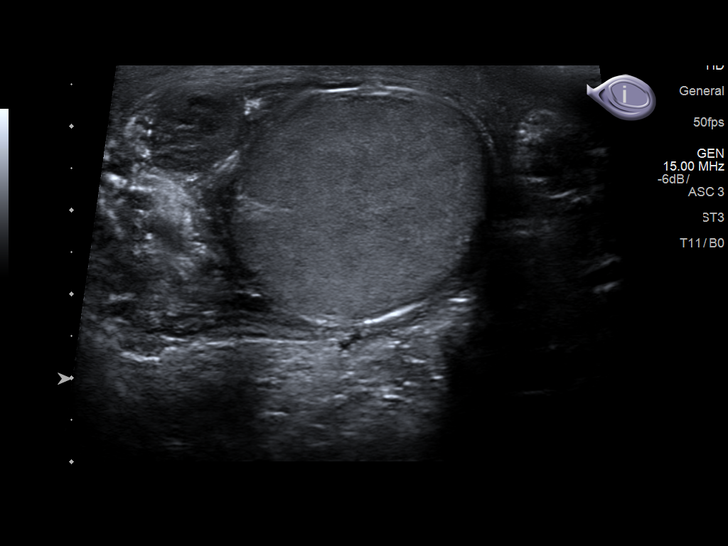
[im 19/57]
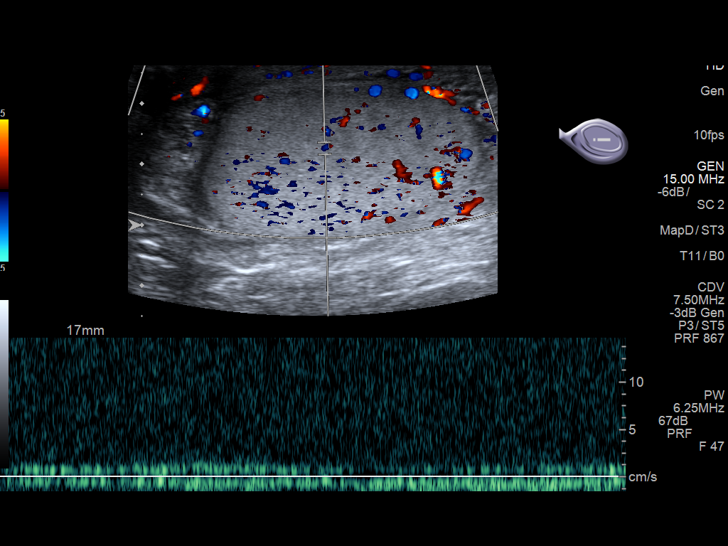
[im 22/57]
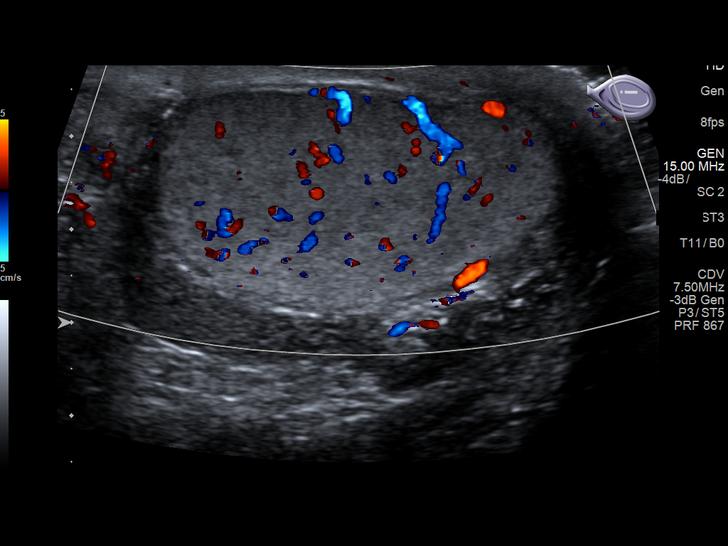
[im 26/57]
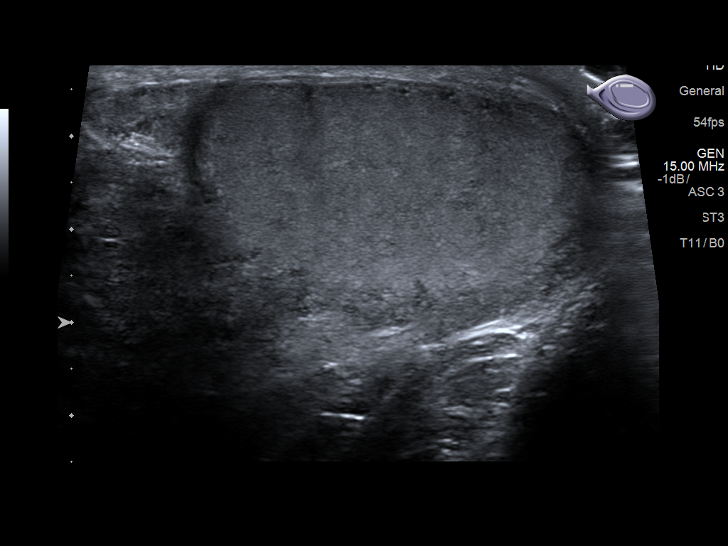
[im 31/57]
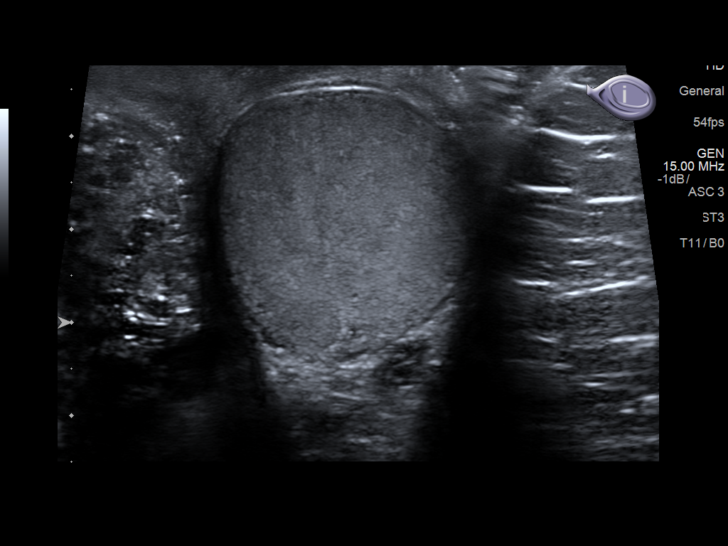
[im 36/57]
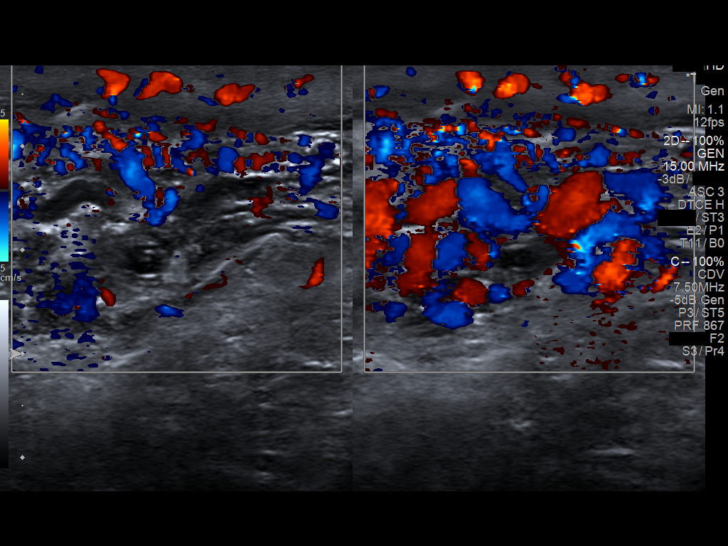
[im 38/57]
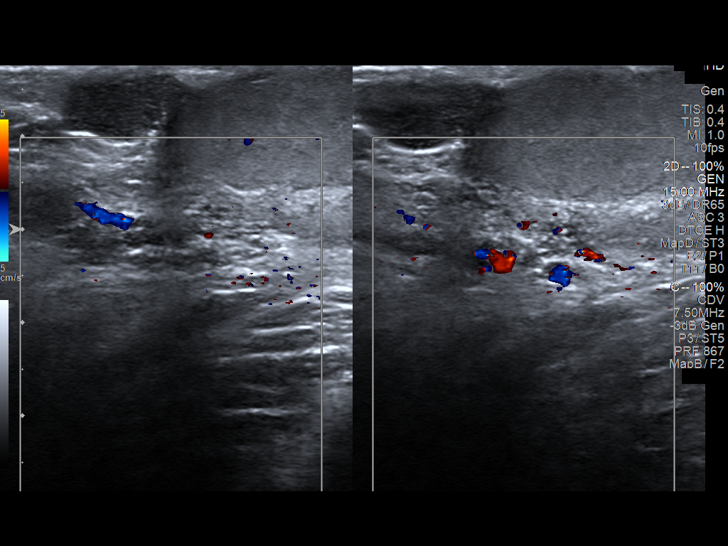
[im 43/57]
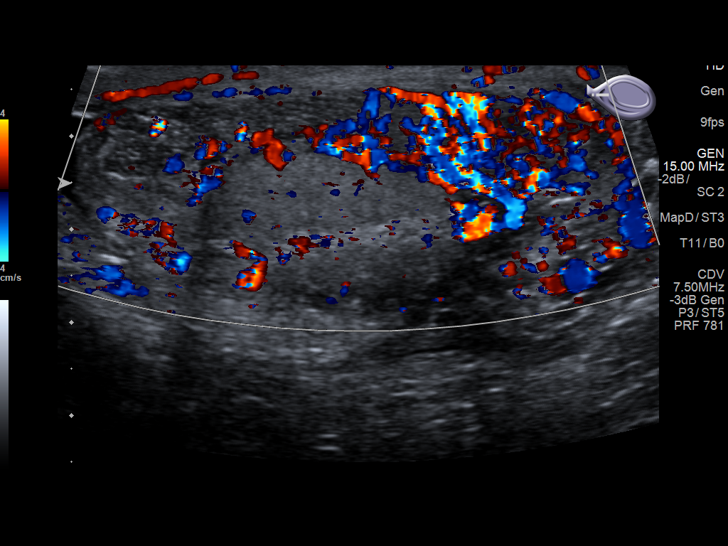
[im 47/57]
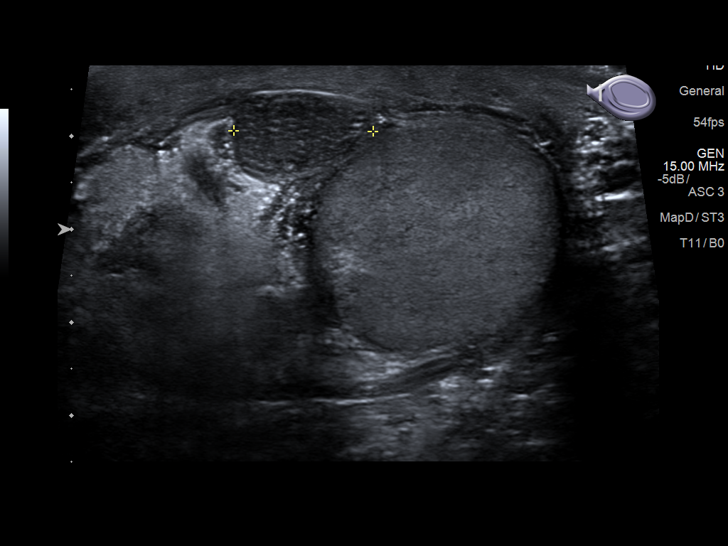
[im 52/57]
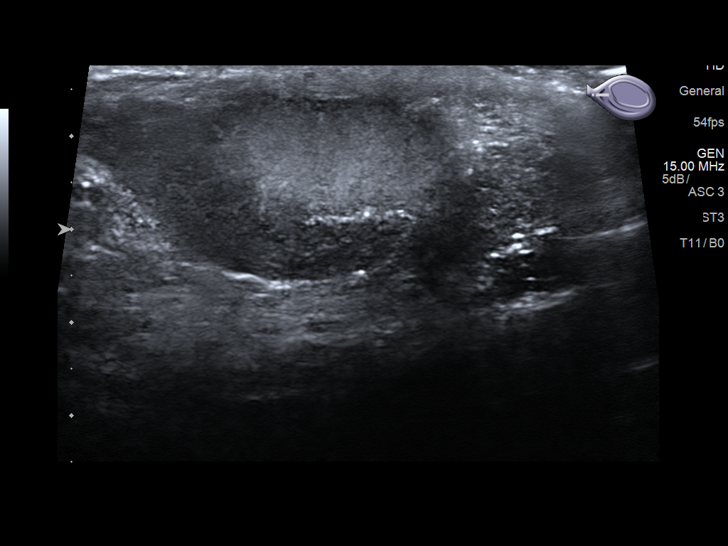
[im 57/57]
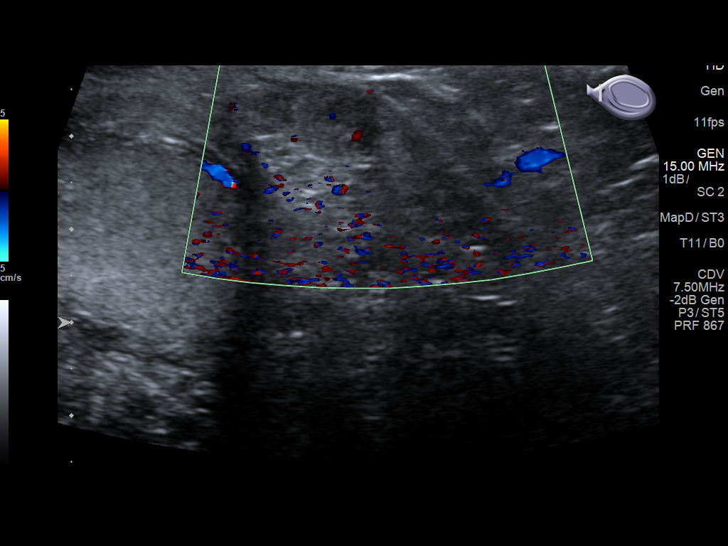

[14 of 25 positions shown; findings below may reference images not displayed]

FINDINGS: Right testicle

Measurements: 4.6 x 2.4 x 3.1 cm. No mass or microlithiasis
visualized.

Left testicle

Measurements: 4.5 x 2.5 x 2.5 cm. No mass or microlithiasis
visualized.

Right epididymis: Normal in size. Heterogeneous with increased
vascularity.

Left epididymis:  Normal in size and appearance.

Hydrocele:  None visualized.

Varicocele:  Right varicocele.

Pulsed Doppler interrogation of both testes demonstrates normal low
resistance arterial and venous waveforms bilaterally.
IMPRESSION: 1. Right epididymitis.
2. Right varicocele.

## 2019-08-16 DIAGNOSIS — G44209 Tension-type headache, unspecified, not intractable: Secondary | ICD-10-CM | POA: Diagnosis not present

## 2019-08-16 DIAGNOSIS — Z1211 Encounter for screening for malignant neoplasm of colon: Secondary | ICD-10-CM | POA: Diagnosis not present

## 2019-08-16 DIAGNOSIS — Z0000 Encounter for general adult medical examination without abnormal findings: Secondary | ICD-10-CM | POA: Diagnosis not present

## 2019-08-16 DIAGNOSIS — Z125 Encounter for screening for malignant neoplasm of prostate: Secondary | ICD-10-CM | POA: Diagnosis not present

## 2019-08-16 DIAGNOSIS — F3340 Major depressive disorder, recurrent, in remission, unspecified: Secondary | ICD-10-CM | POA: Diagnosis not present

## 2019-08-20 DIAGNOSIS — E875 Hyperkalemia: Secondary | ICD-10-CM | POA: Diagnosis not present

## 2019-10-23 DIAGNOSIS — Z0000 Encounter for general adult medical examination without abnormal findings: Secondary | ICD-10-CM | POA: Diagnosis not present

## 2019-10-23 DIAGNOSIS — G44211 Episodic tension-type headache, intractable: Secondary | ICD-10-CM | POA: Diagnosis not present

## 2019-11-27 DIAGNOSIS — F411 Generalized anxiety disorder: Secondary | ICD-10-CM | POA: Diagnosis not present

## 2019-11-27 DIAGNOSIS — F410 Panic disorder [episodic paroxysmal anxiety] without agoraphobia: Secondary | ICD-10-CM | POA: Diagnosis not present

## 2019-11-27 DIAGNOSIS — F339 Major depressive disorder, recurrent, unspecified: Secondary | ICD-10-CM | POA: Diagnosis not present

## 2020-03-11 DIAGNOSIS — F410 Panic disorder [episodic paroxysmal anxiety] without agoraphobia: Secondary | ICD-10-CM | POA: Diagnosis not present

## 2020-03-11 DIAGNOSIS — F411 Generalized anxiety disorder: Secondary | ICD-10-CM | POA: Diagnosis not present

## 2020-03-11 DIAGNOSIS — F339 Major depressive disorder, recurrent, unspecified: Secondary | ICD-10-CM | POA: Diagnosis not present

## 2020-04-02 DIAGNOSIS — L0390 Cellulitis, unspecified: Secondary | ICD-10-CM | POA: Diagnosis not present

## 2020-04-02 DIAGNOSIS — J028 Acute pharyngitis due to other specified organisms: Secondary | ICD-10-CM | POA: Diagnosis not present

## 2020-05-08 DIAGNOSIS — F339 Major depressive disorder, recurrent, unspecified: Secondary | ICD-10-CM | POA: Diagnosis not present

## 2020-05-08 DIAGNOSIS — F410 Panic disorder [episodic paroxysmal anxiety] without agoraphobia: Secondary | ICD-10-CM | POA: Diagnosis not present

## 2020-05-08 DIAGNOSIS — F411 Generalized anxiety disorder: Secondary | ICD-10-CM | POA: Diagnosis not present

## 2020-06-16 DIAGNOSIS — K449 Diaphragmatic hernia without obstruction or gangrene: Secondary | ICD-10-CM | POA: Diagnosis not present

## 2020-06-16 DIAGNOSIS — R109 Unspecified abdominal pain: Secondary | ICD-10-CM | POA: Diagnosis not present

## 2020-06-16 DIAGNOSIS — R1032 Left lower quadrant pain: Secondary | ICD-10-CM | POA: Diagnosis not present

## 2020-06-16 DIAGNOSIS — R319 Hematuria, unspecified: Secondary | ICD-10-CM | POA: Diagnosis not present

## 2020-06-16 DIAGNOSIS — Z87442 Personal history of urinary calculi: Secondary | ICD-10-CM | POA: Diagnosis not present

## 2020-06-16 DIAGNOSIS — N200 Calculus of kidney: Secondary | ICD-10-CM | POA: Diagnosis not present

## 2020-06-16 DIAGNOSIS — Z0000 Encounter for general adult medical examination without abnormal findings: Secondary | ICD-10-CM | POA: Diagnosis not present

## 2020-06-18 DIAGNOSIS — F410 Panic disorder [episodic paroxysmal anxiety] without agoraphobia: Secondary | ICD-10-CM | POA: Diagnosis not present

## 2020-06-18 DIAGNOSIS — F339 Major depressive disorder, recurrent, unspecified: Secondary | ICD-10-CM | POA: Diagnosis not present

## 2020-06-18 DIAGNOSIS — F411 Generalized anxiety disorder: Secondary | ICD-10-CM | POA: Diagnosis not present

## 2020-06-24 DIAGNOSIS — R310 Gross hematuria: Secondary | ICD-10-CM | POA: Diagnosis not present

## 2020-06-24 DIAGNOSIS — K4090 Unilateral inguinal hernia, without obstruction or gangrene, not specified as recurrent: Secondary | ICD-10-CM | POA: Diagnosis not present

## 2020-06-28 ENCOUNTER — Observation Stay
Admission: EM | Admit: 2020-06-28 | Discharge: 2020-06-30 | Disposition: A | Discharge status: Home / Self Care | Payer: PPO | Attending: Urology | Admitting: Urology

## 2020-06-28 ENCOUNTER — Encounter
Admission: EM | Disposition: A | Discharge status: Home / Self Care | Payer: Self-pay | Source: Home / Self Care | Attending: Emergency Medicine

## 2020-06-28 ENCOUNTER — Emergency Department: Payer: PPO

## 2020-06-28 ENCOUNTER — Emergency Department: Payer: PPO | Admitting: Registered Nurse

## 2020-06-28 ENCOUNTER — Other Ambulatory Visit: Payer: Self-pay

## 2020-06-28 ENCOUNTER — Encounter: Payer: Self-pay | Admitting: Urology

## 2020-06-28 DIAGNOSIS — K760 Fatty (change of) liver, not elsewhere classified: Secondary | ICD-10-CM | POA: Diagnosis not present

## 2020-06-28 DIAGNOSIS — Z79899 Other long term (current) drug therapy: Secondary | ICD-10-CM | POA: Insufficient documentation

## 2020-06-28 DIAGNOSIS — N201 Calculus of ureter: Principal | ICD-10-CM | POA: Diagnosis present

## 2020-06-28 DIAGNOSIS — I10 Essential (primary) hypertension: Secondary | ICD-10-CM | POA: Insufficient documentation

## 2020-06-28 DIAGNOSIS — N132 Hydronephrosis with renal and ureteral calculous obstruction: Secondary | ICD-10-CM | POA: Diagnosis not present

## 2020-06-28 DIAGNOSIS — R109 Unspecified abdominal pain: Secondary | ICD-10-CM | POA: Diagnosis present

## 2020-06-28 DIAGNOSIS — Z20822 Contact with and (suspected) exposure to covid-19: Secondary | ICD-10-CM | POA: Insufficient documentation

## 2020-06-28 DIAGNOSIS — F418 Other specified anxiety disorders: Secondary | ICD-10-CM | POA: Diagnosis not present

## 2020-06-28 DIAGNOSIS — K219 Gastro-esophageal reflux disease without esophagitis: Secondary | ICD-10-CM | POA: Diagnosis not present

## 2020-06-28 DIAGNOSIS — R102 Pelvic and perineal pain: Secondary | ICD-10-CM | POA: Diagnosis not present

## 2020-06-28 DIAGNOSIS — K449 Diaphragmatic hernia without obstruction or gangrene: Secondary | ICD-10-CM | POA: Diagnosis not present

## 2020-06-28 HISTORY — DX: Diverticulitis of intestine, part unspecified, without perforation or abscess without bleeding: K57.92

## 2020-06-28 HISTORY — PX: CYSTOSCOPY/URETEROSCOPY/HOLMIUM LASER: SHX6545

## 2020-06-28 HISTORY — DX: Calculus of kidney: N20.0

## 2020-06-28 HISTORY — PX: CYSTOSCOPY WITH STENT PLACEMENT: SHX5790

## 2020-06-28 LAB — RESP PANEL BY RT-PCR (FLU A&B, COVID) ARPGX2
Influenza A by PCR: NEGATIVE
Influenza B by PCR: NEGATIVE
SARS Coronavirus 2 by RT PCR: NEGATIVE

## 2020-06-28 LAB — CBC
HCT: 47.5 % (ref 39.0–52.0)
Hemoglobin: 16.3 g/dL (ref 13.0–17.0)
MCH: 30.2 pg (ref 26.0–34.0)
MCHC: 34.3 g/dL (ref 30.0–36.0)
MCV: 88.1 fL (ref 80.0–100.0)
Platelets: 260 10*3/uL (ref 150–400)
RBC: 5.39 MIL/uL (ref 4.22–5.81)
RDW: 12.7 % (ref 11.5–15.5)
WBC: 8 10*3/uL (ref 4.0–10.5)
nRBC: 0 % (ref 0.0–0.2)

## 2020-06-28 LAB — URINALYSIS, COMPLETE (UACMP) WITH MICROSCOPIC
Bacteria, UA: NONE SEEN
Bilirubin Urine: NEGATIVE
Glucose, UA: NEGATIVE mg/dL
Ketones, ur: NEGATIVE mg/dL
Leukocytes,Ua: NEGATIVE
Nitrite: NEGATIVE
Protein, ur: NEGATIVE mg/dL
Specific Gravity, Urine: 1.011 (ref 1.005–1.030)
Squamous Epithelial / HPF: NONE SEEN (ref 0–5)
pH: 5 (ref 5.0–8.0)

## 2020-06-28 LAB — BASIC METABOLIC PANEL
Anion gap: 9 (ref 5–15)
BUN: 13 mg/dL (ref 6–20)
CO2: 27 mmol/L (ref 22–32)
Calcium: 9.5 mg/dL (ref 8.9–10.3)
Chloride: 104 mmol/L (ref 98–111)
Creatinine, Ser: 1.3 mg/dL — ABNORMAL HIGH (ref 0.61–1.24)
GFR, Estimated: >60 mL/min (ref >60)
Glucose, Bld: 113 mg/dL — ABNORMAL HIGH (ref 70–99)
Potassium: 4.3 mmol/L (ref 3.5–5.1)
Sodium: 140 mmol/L (ref 135–145)

## 2020-06-28 SURGERY — CYSTOSCOPY, WITH STENT INSERTION
Anesthesia: General | Site: Ureter | Laterality: Left

## 2020-06-28 MED ORDER — CEFAZOLIN SODIUM 1 G IJ SOLR
INTRAMUSCULAR | Status: AC
  Filled 2020-06-28: qty 20

## 2020-06-28 MED ORDER — SODIUM CHLORIDE 0.9 % IV SOLN
8.0000 mg | Freq: Three times a day (TID) | INTRAVENOUS | Status: DC | PRN
  Filled 2020-06-28: qty 4

## 2020-06-28 MED ORDER — FENTANYL CITRATE (PF) 100 MCG/2ML IJ SOLN
INTRAMUSCULAR | Status: DC | PRN
  Administered 2020-06-28: 50 ug via INTRAVENOUS

## 2020-06-28 MED ORDER — HYDROMORPHONE HCL 1 MG/ML IJ SOLN
1.0000 mg | Freq: Once | INTRAMUSCULAR | Status: AC
  Administered 2020-06-28: 1 mg via INTRAVENOUS
  Filled 2020-06-28: qty 1

## 2020-06-28 MED ORDER — IOHEXOL 180 MG/ML  SOLN
INTRAMUSCULAR | Status: DC | PRN
  Administered 2020-06-28: 10 mL

## 2020-06-28 MED ORDER — FENTANYL CITRATE (PF) 100 MCG/2ML IJ SOLN
INTRAMUSCULAR | Status: AC
  Filled 2020-06-28: qty 2

## 2020-06-28 MED ORDER — ONDANSETRON HCL 4 MG/2ML IJ SOLN
INTRAMUSCULAR | Status: DC | PRN
  Administered 2020-06-28: 4 mg via INTRAVENOUS

## 2020-06-28 MED ORDER — SUGAMMADEX SODIUM 500 MG/5ML IV SOLN
INTRAVENOUS | Status: AC
  Filled 2020-06-28: qty 5

## 2020-06-28 MED ORDER — LIDOCAINE HCL (PF) 2 % IJ SOLN
INTRAMUSCULAR | Status: AC
  Filled 2020-06-28: qty 5

## 2020-06-28 MED ORDER — MIDAZOLAM HCL 2 MG/2ML IJ SOLN
INTRAMUSCULAR | Status: DC | PRN
  Administered 2020-06-28: 2 mg via INTRAVENOUS

## 2020-06-28 MED ORDER — KETOROLAC TROMETHAMINE 30 MG/ML IJ SOLN
30.0000 mg | Freq: Once | INTRAMUSCULAR | Status: AC
  Administered 2020-06-28: 30 mg via INTRAVENOUS
  Filled 2020-06-28: qty 1

## 2020-06-28 MED ORDER — SUGAMMADEX SODIUM 200 MG/2ML IV SOLN
INTRAVENOUS | Status: DC | PRN
  Administered 2020-06-28: 508 mg via INTRAVENOUS

## 2020-06-28 MED ORDER — LIDOCAINE HCL (CARDIAC) PF 100 MG/5ML IV SOSY
PREFILLED_SYRINGE | INTRAVENOUS | Status: DC | PRN
  Administered 2020-06-28: 100 mg via INTRAVENOUS

## 2020-06-28 MED ORDER — OXYCODONE HCL 5 MG PO TABS
5.0000 mg | ORAL_TABLET | ORAL | Status: DC | PRN
  Administered 2020-06-28 – 2020-06-30 (×8): 10 mg via ORAL
  Filled 2020-06-28 (×8): qty 2

## 2020-06-28 MED ORDER — ROCURONIUM BROMIDE 100 MG/10ML IV SOLN
INTRAVENOUS | Status: DC | PRN
  Administered 2020-06-28: 50 mg via INTRAVENOUS

## 2020-06-28 MED ORDER — FENTANYL CITRATE (PF) 100 MCG/2ML IJ SOLN
50.0000 ug | Freq: Once | INTRAMUSCULAR | Status: AC
  Administered 2020-06-28: 50 ug via NASAL
  Filled 2020-06-28: qty 2

## 2020-06-28 MED ORDER — ROCURONIUM BROMIDE 10 MG/ML (PF) SYRINGE
PREFILLED_SYRINGE | INTRAVENOUS | Status: AC
  Filled 2020-06-28: qty 10

## 2020-06-28 MED ORDER — ACETAMINOPHEN 325 MG PO TABS
650.0000 mg | ORAL_TABLET | ORAL | Status: DC | PRN
  Administered 2020-06-30 (×2): 650 mg via ORAL
  Filled 2020-06-28 (×2): qty 2

## 2020-06-28 MED ORDER — OXYCODONE-ACETAMINOPHEN 5-325 MG PO TABS
1.0000 | ORAL_TABLET | ORAL | 0 refills | Status: DC | PRN
Start: 2020-06-28 — End: 2020-06-30

## 2020-06-28 MED ORDER — BELLADONNA ALKALOIDS-OPIUM 16.2-60 MG RE SUPP
RECTAL | Status: AC
  Filled 2020-06-28: qty 1

## 2020-06-28 MED ORDER — ONDANSETRON HCL 4 MG/2ML IJ SOLN
INTRAMUSCULAR | Status: AC
  Filled 2020-06-28: qty 2

## 2020-06-28 MED ORDER — SODIUM CHLORIDE 0.9 % IV BOLUS
1000.0000 mL | Freq: Once | INTRAVENOUS | Status: AC
  Administered 2020-06-28: 1000 mL via INTRAVENOUS

## 2020-06-28 MED ORDER — ONDANSETRON 4 MG PO TBDP
4.0000 mg | ORAL_TABLET | Freq: Once | ORAL | Status: AC
  Administered 2020-06-28: 4 mg via ORAL
  Filled 2020-06-28: qty 1

## 2020-06-28 MED ORDER — CEFAZOLIN SODIUM-DEXTROSE 2-3 GM-%(50ML) IV SOLR
INTRAVENOUS | Status: DC | PRN
  Administered 2020-06-28: 2 g via INTRAVENOUS

## 2020-06-28 MED ORDER — LIDOCAINE HCL (CARDIAC) PF 100 MG/5ML IV SOSY
1.5000 mg/kg | PREFILLED_SYRINGE | Freq: Once | INTRAVENOUS | Status: AC
  Administered 2020-06-28: 190.6 mg via INTRAVENOUS
  Filled 2020-06-28: qty 10

## 2020-06-28 MED ORDER — PROPOFOL 10 MG/ML IV BOLUS
INTRAVENOUS | Status: DC | PRN
  Administered 2020-06-28: 200 mg via INTRAVENOUS

## 2020-06-28 MED ORDER — ONDANSETRON HCL 4 MG/2ML IJ SOLN: 4.0000 mg | Freq: Once | INTRAMUSCULAR | Status: DC | PRN

## 2020-06-28 MED ORDER — DEXAMETHASONE SODIUM PHOSPHATE 10 MG/ML IJ SOLN
INTRAMUSCULAR | Status: DC | PRN
  Administered 2020-06-28: 10 mg via INTRAVENOUS

## 2020-06-28 MED ORDER — SODIUM CHLORIDE 0.9% FLUSH
3.0000 mL | Freq: Two times a day (BID) | INTRAVENOUS | Status: DC
  Administered 2020-06-29 (×2): 3 mL via INTRAVENOUS

## 2020-06-28 MED ORDER — ACETAMINOPHEN 650 MG RE SUPP: 650.0000 mg | RECTAL | Status: DC | PRN

## 2020-06-28 MED ORDER — MORPHINE SULFATE (PF) 2 MG/ML IV SOLN
2.0000 mg | INTRAVENOUS | Status: DC | PRN
  Administered 2020-06-28 (×2): 2 mg via INTRAVENOUS
  Filled 2020-06-28 (×2): qty 1

## 2020-06-28 MED ORDER — SEVOFLURANE IN SOLN
RESPIRATORY_TRACT | Status: AC
  Filled 2020-06-28: qty 250

## 2020-06-28 MED ORDER — ACETAMINOPHEN 10 MG/ML IV SOLN
INTRAVENOUS | Status: DC | PRN
  Administered 2020-06-28: 1000 mg via INTRAVENOUS

## 2020-06-28 MED ORDER — SODIUM CHLORIDE 0.9 % IV SOLN: 250.0000 mL | INTRAVENOUS | Status: DC | PRN

## 2020-06-28 MED ORDER — LACTATED RINGERS IV SOLN: INTRAVENOUS | Status: DC | PRN

## 2020-06-28 MED ORDER — ACETAMINOPHEN 10 MG/ML IV SOLN
INTRAVENOUS | Status: AC
  Filled 2020-06-28: qty 100

## 2020-06-28 MED ORDER — BELLADONNA ALKALOIDS-OPIUM 16.2-60 MG RE SUPP
RECTAL | Status: DC | PRN
  Administered 2020-06-28: 1 via RECTAL

## 2020-06-28 MED ORDER — MORPHINE SULFATE (PF) 4 MG/ML IV SOLN
4.0000 mg | Freq: Once | INTRAVENOUS | Status: AC
  Administered 2020-06-28: 4 mg via INTRAVENOUS
  Filled 2020-06-28: qty 1

## 2020-06-28 MED ORDER — DEXAMETHASONE SODIUM PHOSPHATE 10 MG/ML IJ SOLN
INTRAMUSCULAR | Status: AC
  Filled 2020-06-28: qty 1

## 2020-06-28 MED ORDER — MIDAZOLAM HCL 2 MG/2ML IJ SOLN
INTRAMUSCULAR | Status: AC
  Filled 2020-06-28: qty 2

## 2020-06-28 MED ORDER — SODIUM CHLORIDE 0.9% FLUSH: 3.0000 mL | INTRAVENOUS | Status: DC | PRN

## 2020-06-28 MED ORDER — PROPOFOL 10 MG/ML IV BOLUS
INTRAVENOUS | Status: AC
  Filled 2020-06-28: qty 20

## 2020-06-28 MED ORDER — FENTANYL CITRATE (PF) 100 MCG/2ML IJ SOLN: 25.0000 ug | INTRAMUSCULAR | Status: DC | PRN

## 2020-06-28 SURGICAL SUPPLY — 18 items
BAG DRAIN CYSTO-URO LG1000N (MISCELLANEOUS) ×2 IMPLANT
CATH URETL 5X70 OPEN END (CATHETERS) ×2 IMPLANT
GLOVE BIO SURGEON STRL SZ8 (GLOVE) ×2 IMPLANT
GOWN STRL REUS W/ TWL LRG LVL4 (GOWN DISPOSABLE) ×1 IMPLANT
GOWN STRL REUS W/ TWL XL LVL3 (GOWN DISPOSABLE) ×1 IMPLANT
GOWN STRL REUS W/TWL LRG LVL4 (GOWN DISPOSABLE) ×1
GOWN STRL REUS W/TWL XL LVL3 (GOWN DISPOSABLE) ×1
GUIDEWIRE STR DUAL SENSOR (WIRE) ×4 IMPLANT
KIT TURNOVER CYSTO (KITS) ×2 IMPLANT
MANIFOLD NEPTUNE II (INSTRUMENTS) ×2 IMPLANT
PACK CYSTO AR (MISCELLANEOUS) ×2 IMPLANT
SET CYSTO W/LG BORE CLAMP LF (SET/KITS/TRAYS/PACK) ×2 IMPLANT
SOL .9 NS 3000ML IRR  AL (IV SOLUTION) ×1
SOL .9 NS 3000ML IRR UROMATIC (IV SOLUTION) ×1 IMPLANT
STENT URET 6FRX24 CONTOUR (STENTS) IMPLANT
STENT URET 6FRX26 CONTOUR (STENTS) ×2 IMPLANT
STENT URET 6FRX28 CONTOUR (STENTS) ×2 IMPLANT
WATER STERILE IRR 1000ML POUR (IV SOLUTION) ×2 IMPLANT

## 2020-06-28 NOTE — ED Provider Notes (Signed)
[facility] Emergency Department Provider Note ____________________________________________   First MD Initiated Contact with Patient 06/28/20 [phone]     (approximate)  I have reviewed the triage vital signs and the nursing notes.   HISTORY  Chief Complaint Flank Pain    HPI Jonathan Aguirre is a 49 y.o. male with PMH as noted below including prior history of kidney stones who presents with left flank pain, radiating to the left groin, which has been present for around 2 weeks intermittently, but became more severe at around 2 AM this morning.  The patient reports associated nausea.  He has also had some intermittent urinary frequency and hesitancy.  The patient had hematuria a few weeks ago when the pain initially started.  He had a CT at an outside hospital in [place] a few weeks ago which showed renal stones, but nothing in the ureter.   Past Medical History:  Diagnosis Date  . Anxiety and depression   . GERD (gastroesophageal reflux disease)   . Hypertension     Patient Active Problem List   Diagnosis Date Noted  . Shortness of breath 12/14/2016  . Hypertension 12/14/2016  . Chest pain 12/14/2016  . Family history of premature CAD 12/14/2016  . Morbid obesity ([place]) 12/14/2016  . Anxiety 12/14/2016  . Dizziness 12/14/2016    Past Surgical History:  Procedure Laterality Date  . COLON SURGERY      Prior to Admission medications   Medication Sig Start Date End Date Taking? Authorizing Provider  DULoxetine (CYMBALTA) 30 MG capsule Take 30 mg by mouth daily. 04/15/20  Yes [provider]  lamoTRIgine (LAMICTAL) 100 MG tablet Take 100 mg by mouth daily.   Yes [provider]  metoprolol succinate (TOPROL-XL) 25 MG 24 hr tablet Take 25 mg by mouth daily.  11/11/17  Yes [provider]  Multiple Vitamin (MULTIVITAMIN) tablet Take 1 tablet by mouth daily.   Yes [provider]  omeprazole (PRILOSEC) 20 MG capsule  Take 20 mg by mouth 2 (two) times daily before a meal.   Yes [provider]  rosuvastatin (CRESTOR) 5 MG tablet Take 1 tablet (5 mg total) by mouth daily. 04/18/18  Yes Gollan, Kathlene November, MD  tiZANidine (ZANAFLEX) 4 MG tablet Take 4 mg by mouth at bedtime. 04/15/20  Yes [provider]  traZODone (DESYREL) 100 MG tablet Take 200 mg by mouth at bedtime.   Yes [provider]  cyclobenzaprine (FLEXERIL) 10 MG tablet Take 10 mg by mouth daily as needed for muscle spasms.    [provider]  hydrOXYzine (ATARAX/VISTARIL) 50 MG tablet Take 50 mg by mouth every 8 (eight) hours as needed for anxiety. 06/26/20   [provider]  ondansetron (ZOFRAN ODT) 4 MG disintegrating tablet Take 1 tablet (4 mg total) by mouth every 8 (eight) hours as needed for nausea or vomiting. Patient not taking: Reported on 06/28/2020 02/04/19   Duffy Bruce, MD    Allergies Patient has no known allergies.  Family History  Problem Relation Age of Onset  . Heart failure Mother   . Hyperlipidemia Mother   . Hypertension Mother   . Prostate cancer Neg Hx   . Bladder Cancer Neg Hx   . Kidney cancer Neg Hx     Social History Social History   Tobacco Use  . Smoking status: Never Smoker  . Smokeless tobacco: Former Network engineer  . Vaping Use: Never used  Substance Use Topics  .  Alcohol use: No  . Drug use: No    Review of Systems  Constitutional: No fever/chills. Eyes: No visual changes. ENT: No sore throat. Cardiovascular: Denies chest pain. Respiratory: Denies shortness of breath. Gastrointestinal: Positive for nausea. Genitourinary: Positive for flank pain. Musculoskeletal: Negative for back pain. Skin: Negative for rash. Neurological: Negative for headache.   ____________________________________________   PHYSICAL EXAM:  VITAL SIGNS: ED Triage Vitals  Enc Vitals Group     BP 06/28/20 0425 (!) 142/98     Pulse Rate 06/28/20 0425 100     Resp  06/28/20 0425 20     Temp 06/28/20 0425 97.7 F (36.5 C)     Temp Source 06/28/20 0425 Oral     SpO2 06/28/20 0425 99 %     Weight 06/28/20 0423 280 lb (127 kg)     Height 06/28/20 0423 6\' 1"  (1.854 m)     Head Circumference --      Peak Flow --      Pain Score 06/28/20 0423 10     Pain Loc --      Pain Edu? --      Excl. in [place]? --     Constitutional: Alert and oriented.  Uncomfortable appearing but in no acute distress. Eyes: Conjunctivae are normal.  No scleral icterus. Head: Atraumatic. Nose: No congestion/rhinnorhea. Mouth/Throat: Mucous membranes are moist.   Neck: Normal range of motion.  Cardiovascular: Normal rate, regular rhythm.  Good peripheral circulation. Respiratory: Normal respiratory effort.  No retractions.  Gastrointestinal: Soft and nontender. No distention.  Genitourinary: Mild left flank tenderness. Musculoskeletal: Extremities warm and well perfused.  Neurologic:  Normal speech and language. No gross focal neurologic deficits are appreciated.  Skin:  Skin is warm and dry. No rash noted. Psychiatric: Mood and affect are normal. Speech and behavior are normal.  ____________________________________________   LABS (all labs ordered are listed, but only abnormal results are displayed)  Labs Reviewed  URINALYSIS, COMPLETE (UACMP) WITH MICROSCOPIC - Abnormal; Notable for the following components:      Result Value   Color, Urine YELLOW (*)    APPearance CLEAR (*)    Hgb urine dipstick LARGE (*)    All other components within normal limits  BASIC METABOLIC PANEL - Abnormal; Notable for the following components:   Glucose, Bld 113 (*)    Creatinine, Ser 1.30 (*)    All other components within normal limits  RESP PANEL BY RT-PCR (FLU A&B, COVID) ARPGX2  CBC   ____________________________________________  EKG   ____________________________________________  RADIOLOGY  CT abdomen/pelvis: 7 mm proximal left ureteral stone with mild  hydronephrosis  ____________________________________________   PROCEDURES  Procedure(s) performed: No  Procedures  Critical Care performed: No ____________________________________________   INITIAL IMPRESSION / ASSESSMENT AND PLAN / ED COURSE  Pertinent labs & imaging results that were available during my care of the patient were reviewed by me and considered in my medical decision making (see chart for details).  49 year old male with PMH as noted above including a history of prior kidney stones presents with left flank pain over the last few weeks which became acutely worse last night.  He has also had some intermittent hematuria.  Per the patient's report, CT at an outside hospital performed a few weeks ago showed some stones in his kidney but not in the ureter.  On exam, the patient is overall well-appearing.  His vital signs are normal except for mild hypertension.  He has mild left flank tenderness.  The physical  exam is otherwise unremarkable.  CT obtained from triage shows a 7 mm stone in the left proximal ureter.  The patient reports improved pain with morphine, however he is still quite uncomfortable.  At this time, the overall goal will be adequate pain control in the ED.  I will give additional analgesia and fluids.  If the patient's pain can be adequately controlled, he should be able to follow-up as an outpatient with urology.  If he continues to have refractory pain, he may need urology consultation in the ED.  ----------------------------------------- 2:55 PM on 06/28/2020 -----------------------------------------  I consulted Dr. Jeffie Pollock from urology who advised that the patient could be set up for stent placement today if he had refractory pain.  The patient initially had good improvement after Toradol and IV lidocaine, however he has now had worsening recurrent pain requiring several doses of Dilaudid.  I discussed the case again with Dr. Jeffie Pollock, who is arranging for a  stent placement.  We will continue to monitor the patient's pain in the ED.  ____________________________________________   FINAL CLINICAL IMPRESSION(S) / ED DIAGNOSES  Final diagnoses:  Ureteral stone      NEW MEDICATIONS STARTED DURING THIS VISIT:  New Prescriptions   No medications on file     Note:  This document was prepared using Dragon voice recognition software and may include unintentional dictation errors.    Arta Silence, MD 06/28/20 [phone]

## 2020-06-28 NOTE — ED Triage Notes (Signed)
Patient reports left flank pain that radiates into left lower abdomen.  Patient reports history of kidney stones.

## 2020-06-28 NOTE — ED Notes (Signed)
Pt presents to ED with c/o of L flank pain. Pt states HX of kidney stones and this has been ongoing and pt states he is supposed to f/up with urology in December, but states he couldn't wait that long and states L flank pain became so severe last night that's why he came to the ED. NAD noted at this time. Pt is hypertensive and states a HX of hypertension. Pt denies issues urinating at this time. Pt is A&Ox4. Daughter at bedside.

## 2020-06-28 NOTE — Anesthesia Procedure Notes (Signed)
Procedure Name: Intubation Date/Time: 06/28/2020 6:18 PM Performed by: Doreen Salvage, CRNA Pre-anesthesia Checklist: Patient identified, Patient being monitored, Timeout performed, Emergency Drugs available and Suction available Patient Re-evaluated:Patient Re-evaluated prior to induction Oxygen Delivery Method: Circle system utilized Preoxygenation: Pre-oxygenation with 100% oxygen Induction Type: IV induction Ventilation: Mask ventilation without difficulty Laryngoscope Size: Mac, 4 and McGraph Grade View: Grade I Tube type: Oral Tube size: 7.5 mm Number of attempts: 1 Airway Equipment and Method: Stylet Placement Confirmation: ETT inserted through vocal cords under direct vision,  positive ETCO2 and breath sounds checked- equal and bilateral Secured at: 22 cm Tube secured with: Tape Dental Injury: Teeth and Oropharynx as per pre-operative assessment

## 2020-06-28 NOTE — Transfer of Care (Signed)
Immediate Anesthesia Transfer of Care Note  Patient: Jonathan Aguirre  Procedure(s) Performed: Procedure(s): CYSTOSCOPY WITH STENT PLACEMENT (Left) CYSTOSCOPY/URETEROSCOPY (Left)  Patient Location: PACU  Anesthesia Type:General  Level of Consciousness: sedated  Airway & Oxygen Therapy: Patient Spontanous Breathing and Patient connected to face mask oxygen  Post-op Assessment: Report given to RN and Post -op Vital signs reviewed and stable  Post vital signs: Reviewed and stable  Last Vitals:  Vitals:   06/28/20 1630 06/28/20 1909  BP: (!) 138/100 132/88  Pulse: (!) 105   Resp:  16  Temp:  36.6 C  SpO2: 41%     Complications: No apparent anesthesia complications

## 2020-06-28 NOTE — Anesthesia Preprocedure Evaluation (Signed)
Anesthesia Evaluation  Patient identified by MRN, date of birth, ID band Patient awake    Reviewed: Allergy & Precautions, NPO status , Patient's Chart, lab work & pertinent test results  History of Anesthesia Complications Negative for: history of anesthetic complications  Airway Mallampati: III       Dental   Pulmonary neg sleep apnea, neg COPD, Not current smoker,           Cardiovascular hypertension, Pt. on medications and Pt. on home beta blockers (-) Past MI and (-) CHF (-) dysrhythmias (-) Valvular Problems/Murmurs     Neuro/Psych neg Seizures Anxiety Depression    GI/Hepatic Neg liver ROS, GERD  Medicated and Controlled,  Endo/Other  neg diabetes  Renal/GU Renal disease (stones)     Musculoskeletal   Abdominal   Peds  Hematology   Anesthesia Other Findings   Reproductive/Obstetrics                             Anesthesia Physical Anesthesia Plan  ASA: III and emergent  Anesthesia Plan: General   Post-op Pain Management:    Induction: Intravenous  PONV Risk Score and Plan: 2 and Dexamethasone and Ondansetron  Airway Management Planned: Oral ETT  Additional Equipment:   Intra-op Plan:   Post-operative Plan:   Informed Consent: I have reviewed the patients History and Physical, chart, labs and discussed the procedure including the risks, benefits and alternatives for the proposed anesthesia with the patient or authorized representative who has indicated his/her understanding and acceptance.       Plan Discussed with:   Anesthesia Plan Comments:         Anesthesia Quick Evaluation

## 2020-06-28 NOTE — Anesthesia Postprocedure Evaluation (Signed)
Anesthesia Post Note  Patient: Jonathan Aguirre  Procedure(s) Performed: CYSTOSCOPY WITH STENT PLACEMENT (Left Ureter) CYSTOSCOPY/URETEROSCOPY (Left Ureter)  Patient location during evaluation: PACU Anesthesia Type: General Level of consciousness: awake and alert Pain management: pain level controlled Vital Signs Assessment: post-procedure vital signs reviewed and stable Respiratory status: spontaneous breathing and respiratory function stable Cardiovascular status: stable Anesthetic complications: no   No complications documented.   Last Vitals:  Vitals:   06/28/20 1924 06/28/20 1939  BP: (!) 133/97 (!) 138/98  Pulse: 91 94  Resp: 13 13  Temp:    SpO2: 99% 97%    Last Pain:  Vitals:   06/28/20 1939  TempSrc:   PainSc: 0-No pain                 Hilton Saephan K

## 2020-06-28 NOTE — Consult Note (Signed)
Subjective: 1. Ureteral stone     Jonathan Aguirre is a 49 yo WM who had the onset on 06/10/20 of hematuria that was intermittent and was associated with some frequency and urgency.  He had a scan earlier this month that showed a stone in the kidney.  He had the onset early this morning of severe left flank pain with nausea and was found to have a 37mm left proximal ureteral stone and a 7mm LLP stone on CT in the ER.   His pain has been difficult to manage and he still has some pain.   He has a history of stones and had one prior endoscopic extraction.  He doesn't recall the stone composition, but I was able to find the analysis from 2013 and it was calcium oxalate.   He has had about 50% of his colon removed in the past for diverticular disease.     ROS:  Review of Systems  Constitutional: Negative for chills and fever.  Cardiovascular: Negative for chest pain.  Genitourinary: Positive for flank pain and hematuria.  All other systems reviewed and are negative.   No Known Allergies  Past Medical History:  Diagnosis Date  . Anxiety and depression   . GERD (gastroesophageal reflux disease)   . Hypertension     Past Surgical History:  Procedure Laterality Date  . COLON SURGERY    . CYSTOSCOPY/RETROGRADE/URETEROSCOPY/STONE EXTRACTION WITH BASKET  2016    Social History   Socioeconomic History  . Marital status: Married    Spouse name: Not on file  . Number of children: Not on file  . Years of education: Not on file  . Highest education level: Not on file  Occupational History  . Not on file  Tobacco Use  . Smoking status: Never Smoker  . Smokeless tobacco: Former Network engineer  . Vaping Use: Never used  Substance and Sexual Activity  . Alcohol use: No  . Drug use: No  . Sexual activity: Not on file  Other Topics Concern  . Not on file  Social History Narrative  . Not on file   Social Determinants of Health   Financial Resource Strain:   . Difficulty of Paying Living  Expenses: Not on file  Food Insecurity:   . Worried About Charity fundraiser in the Last Year: Not on file  . Ran Out of Food in the Last Year: Not on file  Transportation Needs:   . Lack of Transportation (Medical): Not on file  . Lack of Transportation (Non-Medical): Not on file  Physical Activity:   . Days of Exercise per Week: Not on file  . Minutes of Exercise per Session: Not on file  Stress:   . Feeling of Stress : Not on file  Social Connections:   . Frequency of Communication with Friends and Family: Not on file  . Frequency of Social Gatherings with Friends and Family: Not on file  . Attends Religious Services: Not on file  . Active Member of Clubs or Organizations: Not on file  . Attends Archivist Meetings: Not on file  . Marital Status: Not on file  Intimate Partner Violence:   . Fear of Current or Ex-Partner: Not on file  . Emotionally Abused: Not on file  . Physically Abused: Not on file  . Sexually Abused: Not on file    Family History  Problem Relation Age of Onset  . Heart failure Mother   . Hyperlipidemia Mother   . Hypertension  Mother   . Prostate cancer Neg Hx   . Bladder Cancer Neg Hx   . Kidney cancer Neg Hx     Anti-infectives: Anti-infectives (From admission, onward)   None      No current facility-administered medications for this encounter.   Current Outpatient Medications  Medication Sig Dispense Refill  . DULoxetine (CYMBALTA) 30 MG capsule Take 30 mg by mouth daily.    Marland Kitchen lamoTRIgine (LAMICTAL) 100 MG tablet Take 100 mg by mouth daily.    . metoprolol succinate (TOPROL-XL) 25 MG 24 hr tablet Take 25 mg by mouth daily.     . Multiple Vitamin (MULTIVITAMIN) tablet Take 1 tablet by mouth daily.    Marland Kitchen omeprazole (PRILOSEC) 20 MG capsule Take 20 mg by mouth 2 (two) times daily before a meal.    . rosuvastatin (CRESTOR) 5 MG tablet Take 1 tablet (5 mg total) by mouth daily. 90 tablet 4  . tiZANidine (ZANAFLEX) 4 MG tablet Take 4 mg  by mouth at bedtime.    . traZODone (DESYREL) 100 MG tablet Take 200 mg by mouth at bedtime.    . cyclobenzaprine (FLEXERIL) 10 MG tablet Take 10 mg by mouth daily as needed for muscle spasms.    . hydrOXYzine (ATARAX/VISTARIL) 50 MG tablet Take 50 mg by mouth every 8 (eight) hours as needed for anxiety.    . ondansetron (ZOFRAN ODT) 4 MG disintegrating tablet Take 1 tablet (4 mg total) by mouth every 8 (eight) hours as needed for nausea or vomiting. (Patient not taking: Reported on 06/28/2020) 20 tablet 0     Objective: Vital signs in last 24 hours: BP (!) 119/93   Pulse 73   Temp 97.7 F (36.5 C) (Oral)   Resp 18   Ht 6\' 1"  (1.854 m)   Wt 127 kg   SpO2 94%   BMI 36.94 kg/m   Intake/Output from previous day: No intake/output data recorded. Intake/Output this shift: No intake/output data recorded.   Physical Exam Vitals reviewed.  Constitutional:      Appearance: Normal appearance.  Cardiovascular:     Rate and Rhythm: Normal rate and regular rhythm.     Pulses: Normal pulses.  Pulmonary:     Effort: Pulmonary effort is normal. No respiratory distress.     Breath sounds: Normal breath sounds.  Abdominal:     Tenderness: There is abdominal tenderness. There is left CVA tenderness.  Musculoskeletal:        General: No swelling or tenderness. Normal range of motion.  Skin:    General: Skin is warm and dry.  Neurological:     General: No focal deficit present.     Mental Status: He is alert and oriented to person, place, and time.  Psychiatric:        Mood and Affect: Mood normal.        Behavior: Behavior normal.     Lab Results:  Results for orders placed or performed during the hospital encounter of 06/28/20 (from the past 24 hour(s))  Basic metabolic panel     Status: Abnormal   Collection Time: 06/28/20  4:29 AM  Result Value Ref Range   Sodium 140 135 - 145 mmol/L   Potassium 4.3 3.5 - 5.1 mmol/L   Chloride 104 98 - 111 mmol/L   CO2 27 22 - 32 mmol/L    Glucose, Bld 113 (H) 70 - 99 mg/dL   BUN 13 6 - 20 mg/dL   Creatinine, Ser 1.30 (H) 0.61 -  1.24 mg/dL   Calcium 9.5 8.9 - 10.3 mg/dL   GFR, Estimated >60 >60 mL/min   Anion gap 9 5 - 15  CBC     Status: None   Collection Time: 06/28/20  4:29 AM  Result Value Ref Range   WBC 8.0 4.0 - 10.5 K/uL   RBC 5.39 4.22 - 5.81 MIL/uL   Hemoglobin 16.3 13.0 - 17.0 g/dL   HCT 47.5 39 - 52 %   MCV 88.1 80.0 - 100.0 fL   MCH 30.2 26.0 - 34.0 pg   MCHC 34.3 30.0 - 36.0 g/dL   RDW 12.7 11.5 - 15.5 %   Platelets 260 150 - 400 K/uL   nRBC 0.0 0.0 - 0.2 %  Resp Panel by RT-PCR (Flu A&B, Covid)     Status: None   Collection Time: 06/28/20  1:31 PM   Specimen: Nasopharyngeal(NP) swabs in vial transport medium  Result Value Ref Range   SARS Coronavirus 2 by RT PCR NEGATIVE NEGATIVE   Influenza A by PCR NEGATIVE NEGATIVE   Influenza B by PCR NEGATIVE NEGATIVE  Urinalysis, Complete w Microscopic     Status: Abnormal   Collection Time: 06/28/20  1:32 PM  Result Value Ref Range   Color, Urine YELLOW (A) YELLOW   APPearance CLEAR (A) CLEAR   Specific Gravity, Urine 1.011 1.005 - 1.030   pH 5.0 5.0 - 8.0   Glucose, UA NEGATIVE NEGATIVE mg/dL   Hgb urine dipstick LARGE (A) NEGATIVE   Bilirubin Urine NEGATIVE NEGATIVE   Ketones, ur NEGATIVE NEGATIVE mg/dL   Protein, ur NEGATIVE NEGATIVE mg/dL   Nitrite NEGATIVE NEGATIVE   Leukocytes,Ua NEGATIVE NEGATIVE   RBC / HPF 11-20 0 - 5 RBC/hpf   WBC, UA 0-5 0 - 5 WBC/hpf   Bacteria, UA NONE SEEN NONE SEEN   Squamous Epithelial / LPF NONE SEEN 0 - 5   Mucus PRESENT     BMET Recent Labs    06/28/20 0429  NA 140  K 4.3  CL 104  CO2 27  GLUCOSE 113*  BUN 13  CREATININE 1.30*  CALCIUM 9.5   PT/INR No results for input(s): LABPROT, INR in the last 72 hours. ABG No results for input(s): PHART, HCO3 in the last 72 hours.  Invalid input(s): PCO2, PO2  Studies/Results: CT Renal Stone Study  Result Date: 06/28/2020 CLINICAL DATA:  49 year old male  with LEFT abdominal and pelvic pain. EXAM: CT ABDOMEN AND PELVIS WITHOUT CONTRAST TECHNIQUE: Multidetector CT imaging of the abdomen and pelvis was performed following the standard protocol without IV contrast. COMPARISON:  02/04/2019 CT and prior studies FINDINGS: Please note that parenchymal abnormalities may be missed without intravenous contrast. Lower chest: No acute abnormality. Hepatobiliary: Hepatic steatosis noted without definite focal hepatic abnormality. Gallbladder is unremarkable. No biliary dilatation. Pancreas: Unremarkable Spleen: Unremarkable Adrenals/Urinary Tract: A 7 mm proximal LEFT ureteral calculus causes mild LEFT hydronephrosis and perinephric inflammation. A nonobstructing 2 mm mid LEFT renal calculus and a nonobstructing 4 mm LOWER LEFT renal calculus are identified. The RIGHT kidney, adrenal glands and bladder are unremarkable. Stomach/Bowel: A small hiatal hernia is noted. There is no evidence of bowel obstruction, definite bowel wall thickening or inflammatory changes. Bowel surgical changes are again identified. The appendix is normal. Vascular/Lymphatic: No significant vascular findings are present. No enlarged abdominal or pelvic lymph nodes. Reproductive: Prostate is unremarkable. Other: No ascites, focal collection or pneumoperitoneum. Musculoskeletal: No acute or suspicious bony abnormalities. IMPRESSION: 1. 7 mm proximal LEFT ureteral calculus  causing mild LEFT hydronephrosis. 2. Nonobstructing LEFT renal calculi. 3. Hepatic steatosis. 4. Small hiatal hernia. Electronically Signed   By: Margarette Canada M.D.   On: 06/28/2020 06:35   I have reviewed his ER notes, labs and imaging.    Assessment/Plan: Left proximal stone with intractable pain.   I am going to take him for cystoscopy with left RTG, possible ureteroscopy with laser and left ureteral stenting.   I have reviewed the risks of bleeding, infection, ureteral and other GU injury, need for secondary procedures, stent  irritation, thrombotic events and anesthetic complications.          CC: Dr. Arta Silence.     Irine Seal 06/28/2020 [phone]

## 2020-06-29 ENCOUNTER — Encounter: Payer: Self-pay | Admitting: Urology

## 2020-06-29 DIAGNOSIS — N132 Hydronephrosis with renal and ureteral calculous obstruction: Secondary | ICD-10-CM | POA: Diagnosis not present

## 2020-06-29 MED ORDER — PANTOPRAZOLE SODIUM 40 MG PO TBEC
40.0000 mg | DELAYED_RELEASE_TABLET | Freq: Every day | ORAL | Status: DC
  Administered 2020-06-29: 40 mg via ORAL
  Filled 2020-06-29: qty 1

## 2020-06-29 MED ORDER — DULOXETINE HCL 30 MG PO CPEP
30.0000 mg | ORAL_CAPSULE | Freq: Every day | ORAL | Status: DC
  Administered 2020-06-29 – 2020-06-30 (×2): 30 mg via ORAL
  Filled 2020-06-29 (×2): qty 1

## 2020-06-29 MED ORDER — LAMOTRIGINE 100 MG PO TABS
100.0000 mg | ORAL_TABLET | Freq: Every day | ORAL | Status: DC
  Administered 2020-06-29 – 2020-06-30 (×2): 100 mg via ORAL
  Filled 2020-06-29 (×2): qty 1

## 2020-06-29 MED ORDER — HYDROMORPHONE HCL 1 MG/ML IJ SOLN
1.0000 mg | INTRAMUSCULAR | Status: DC | PRN
  Administered 2020-06-29 (×7): 1 mg via INTRAVENOUS
  Filled 2020-06-29 (×7): qty 1

## 2020-06-29 MED ORDER — PHENAZOPYRIDINE HCL 200 MG PO TABS
200.0000 mg | ORAL_TABLET | Freq: Three times a day (TID) | ORAL | Status: DC | PRN
  Administered 2020-06-29 – 2020-06-30 (×3): 200 mg via ORAL
  Filled 2020-06-29 (×6): qty 1

## 2020-06-29 MED ORDER — TIZANIDINE HCL 4 MG PO TABS
4.0000 mg | ORAL_TABLET | Freq: Every day | ORAL | Status: DC
  Administered 2020-06-29: 4 mg via ORAL
  Filled 2020-06-29 (×2): qty 1

## 2020-06-29 MED ORDER — TAMSULOSIN HCL 0.4 MG PO CAPS
0.4000 mg | ORAL_CAPSULE | Freq: Every day | ORAL | Status: DC
  Administered 2020-06-29 – 2020-06-30 (×2): 0.4 mg via ORAL
  Filled 2020-06-29 (×2): qty 1

## 2020-06-29 MED ORDER — ROSUVASTATIN CALCIUM 10 MG PO TABS
5.0000 mg | ORAL_TABLET | Freq: Every day | ORAL | Status: DC
  Administered 2020-06-29 – 2020-06-30 (×2): 5 mg via ORAL
  Filled 2020-06-29 (×2): qty 1

## 2020-06-29 MED ORDER — OXYBUTYNIN CHLORIDE 5 MG PO TABS
5.0000 mg | ORAL_TABLET | Freq: Three times a day (TID) | ORAL | Status: DC | PRN
  Administered 2020-06-29 – 2020-06-30 (×4): 5 mg via ORAL
  Filled 2020-06-29 (×4): qty 1

## 2020-06-29 MED ORDER — METOPROLOL SUCCINATE ER 25 MG PO TB24
25.0000 mg | ORAL_TABLET | Freq: Every day | ORAL | Status: DC
  Administered 2020-06-29 – 2020-06-30 (×2): 25 mg via ORAL
  Filled 2020-06-29 (×2): qty 1

## 2020-06-29 MED ORDER — KETOROLAC TROMETHAMINE 15 MG/ML IJ SOLN
15.0000 mg | Freq: Four times a day (QID) | INTRAMUSCULAR | Status: DC | PRN
  Administered 2020-06-29 (×4): 15 mg via INTRAVENOUS
  Filled 2020-06-29 (×4): qty 1

## 2020-06-29 MED ORDER — BELLADONNA ALKALOIDS-OPIUM 16.2-60 MG RE SUPP
1.0000 | Freq: Four times a day (QID) | RECTAL | Status: DC | PRN
  Administered 2020-06-29: 1 via RECTAL
  Filled 2020-06-29 (×4): qty 1

## 2020-06-29 MED ORDER — PANTOPRAZOLE SODIUM 40 MG PO TBEC
40.0000 mg | DELAYED_RELEASE_TABLET | Freq: Two times a day (BID) | ORAL | Status: DC
  Administered 2020-06-29 – 2020-06-30 (×2): 40 mg via ORAL
  Filled 2020-06-29 (×2): qty 1

## 2020-06-29 MED ORDER — TRAZODONE HCL 100 MG PO TABS
200.0000 mg | ORAL_TABLET | Freq: Every day | ORAL | Status: DC
  Administered 2020-06-29: 200 mg via ORAL
  Filled 2020-06-29: qty 2

## 2020-06-29 NOTE — Progress Notes (Signed)
Pt continued to have pain after Morphine given twice and oxycodone given one. Primary nurse paged and spoke to Dr. Jeffie Pollock. Orders received for Oxybutnin, Toradol and Dilaudid. Primary nurse to continue to monitor pt closely.

## 2020-06-29 NOTE — Progress Notes (Signed)
   06/28/20 2357 06/29/20 0058  Assess: MEWS Score  Temp 98.1 F (36.7 C) 97.8 F (36.6 C)  BP (!) 141/95 127/80  Pulse Rate (!) 115 (!) 102  Resp 20 20  SpO2 94 % 92 %  O2 Device Room Air Room Air  Assess: MEWS Score  MEWS Temp 0 0  MEWS Systolic 0 0  MEWS Pulse 2 1  MEWS RR 0 0  MEWS LOC 0 0  MEWS Score 2 1  MEWS Score Color Yellow Green  Assess: if the MEWS score is Yellow or Red  Were vital signs taken at a resting state? Yes  --   Focused Assessment No change from prior assessment  --   Early Detection of Sepsis Score *See Row Information* Low  --   MEWS guidelines implemented *See Row Information*  --  No, vital signs rechecked (MD notified of increased pain. Medication given. Pt HR lower)

## 2020-06-29 NOTE — Op Note (Addendum)
Procedure: 1.  Cystoscopy with left retrograde pyelogram and interpretation. 2.  Left ureteroscopy. 3.  Cystoscopy with insertion of left double-J stent. 4.  Application of fluoroscopy.  Preop diagnosis: Left proximal ureteral stone.  Postop diagnosis: Same with proximal ureteral stricture.  Surgeon: Dr. Irine Seal.  Anesthesia: General.  Specimen: None.  Drain: 6 [place] by 28 cm left contour double-J stent.  EBL: None.  Complications: None.  Indications: Jonathan Aguirre is a 49 year old male with a history of stones who presented to the emergency room with severe left flank pain.  He was found to have a 7 mm left proximal ureteral stone.  His pain was intractable and he is elected to undergo endoscopic management.  Procedure: He was taken operating room where he was given 2 g of Ancef.  A general anesthetic was induced.  He was placed in lithotomy position.  His perineum and genitalia were prepped with Betadine solution he was draped in usual sterile fashion.  Cystoscopy was performed using 23 [place] scope and 30 degree lens.  Examination revealed a normal urethra.  The external sphincter was intact.  The prostatic urethra was short obstruction.  Examination of bladder revealed mild trabeculation.  No mucosal lesions were identified.  Ureteral orifices were in the normal anatomic position.  The left ureteral orifice was cannulated with a 5 [place] open-ended catheter and Omnipaque was instilled.  Left retrograde pyelogram revealed what appeared to be a normal caliber ureter to a filling defect in the upper proximal ureter with some mild proximal dilation.  A sensor wire was advanced the open-ended catheter to the kidney.  The cystoscope was removed and the inner core of a 12/14 French 35 cm digital access sheath was used to dilate the ureter.  There was slight resistance at the meatus but more significant resistance in the mid proximal ureter that I did not force.  The sheath was then assembled  and advanced but I was not able to get the assembled sheath through the meatus.  At this point the dual-lumen digital flexible ureteroscope was advanced over the wire.  I was able to negotiate it through the distal ureter up to the lower proximal ureter but at that point there was some moderate stricturing of the ureter that would not allow passage of the scope.  A single lumen scope was not available.  The ureteroscope was then removed and the cystoscope was reinserted over the wire.  A 6 French by 28 cm contour double-J stent without tether was passed over the wire to the kidney under fluoroscopic guidance.  The wire was removed, leaving a good coil in the kidney and a good coil in the bladder.  The bladder was drained.  He was taken down from lithotomy position, his anesthetic was reversed and he was moved to recovery in stable condition.  There were no complications.

## 2020-06-29 NOTE — Progress Notes (Signed)
1 Day Post-Op  Subjective: Sharmarke has had severe stent colic with voiding through the night and has required continued parenteral pain meds.   His urine is clearing and he is voiding good volumes at a time but has some frequency.  He has no fever or nausea.   Oxybutynin has not helped at this point.  ROS:  Review of Systems  All other systems reviewed and are negative.   Anti-infectives: Anti-infectives (From admission, onward)   None      Current Facility-Administered Medications  Medication Dose Route Frequency Provider Last Rate Last Admin  . 0.9 %  sodium chloride infusion  250 mL Intravenous PRN Irine Seal, MD      . acetaminophen (TYLENOL) tablet 650 mg  650 mg Oral Q4H PRN Irine Seal, MD       Or  . acetaminophen (TYLENOL) suppository 650 mg  650 mg Rectal Q4H PRN Irine Seal, MD      . belladonna-opium (B&O) suppository 16.2-30 mg  1 suppository Rectal Q6H PRN Irine Seal, MD      . DULoxetine (CYMBALTA) DR capsule 30 mg  30 mg Oral Daily Irine Seal, MD      . HYDROmorphone (DILAUDID) injection 1 mg  1 mg Intravenous Q2H PRN Irine Seal, MD   1 mg at 06/29/20 8003  . ketorolac (TORADOL) 15 MG/ML injection 15 mg  15 mg Intravenous Q6H PRN Irine Seal, MD   15 mg at 06/29/20 4917  . lamoTRIgine (LAMICTAL) tablet 100 mg  100 mg Oral Daily Irine Seal, MD      . metoprolol succinate (TOPROL-XL) 24 hr tablet 25 mg  25 mg Oral Daily Irine Seal, MD      . morphine 2 MG/ML injection 2 mg  2 mg Intravenous Q2H PRN Irine Seal, MD   2 mg at 06/28/20 2250  . ondansetron (ZOFRAN) 8 mg in sodium chloride 0.9 % 50 mL IVPB  8 mg Intravenous Q8H PRN Irine Seal, MD      . oxybutynin (DITROPAN) tablet 5 mg  5 mg Oral TID PRN Irine Seal, MD   5 mg at 06/29/20 1101  . oxyCODONE (Oxy IR/ROXICODONE) immediate release tablet 5-10 mg  5-10 mg Oral Q4H PRN Irine Seal, MD   10 mg at 06/29/20 1102  . pantoprazole (PROTONIX) EC tablet 40 mg  40 mg Oral Daily Irine Seal, MD      . phenazopyridine  (PYRIDIUM) tablet 200 mg  200 mg Oral TID PRN Irine Seal, MD      . rosuvastatin (CRESTOR) tablet 5 mg  5 mg Oral Daily Irine Seal, MD      . sodium chloride flush (NS) 0.9 % injection 3 mL  3 mL Intravenous Q12H Irine Seal, MD   3 mL at 06/29/20 1110  . sodium chloride flush (NS) 0.9 % injection 3 mL  3 mL Intravenous PRN Irine Seal, MD      . tamsulosin (FLOMAX) capsule 0.4 mg  0.4 mg Oral Daily Irine Seal, MD      . tiZANidine (ZANAFLEX) tablet 4 mg  4 mg Oral QHS Irine Seal, MD      . traZODone (DESYREL) tablet 200 mg  200 mg Oral QHS Irine Seal, MD         Objective: Vital signs in last 24 hours: Temp:  [97.8 F (36.6 C)-98.9 F (37.2 C)] 97.9 F (36.6 C) (11/21 0428) Pulse Rate:  [73-115] 92 (11/21 0428) Resp:  [13-20] 20 (11/21 0428) BP: (107-146)/(76-101) 128/88 (  11/21 0428) SpO2:  [92 %-99 %] 96 % (11/21 0428)  Intake/Output from previous day: 11/20 0701 - 11/21 0700 In: 320 [I.V.:200; IV Piggyback:120] Out: 1350 [Urine:1350] Intake/Output this shift: Total I/O In: 240 [P.O.:240] Out: 755 [Urine:755]   Physical Exam Vitals reviewed.  Constitutional:      Appearance: Normal appearance. He is obese.  Abdominal:     Palpations: Abdomen is soft.     Tenderness: There is abdominal tenderness (mild LLQ. ).  Neurological:     Mental Status: He is alert.     Lab Results:  Recent Labs    06/28/20 0429  WBC 8.0  HGB 16.3  HCT 47.5  PLT 260   BMET Recent Labs    06/28/20 0429  NA 140  K 4.3  CL 104  CO2 27  GLUCOSE 113*  BUN 13  CREATININE 1.30*  CALCIUM 9.5   PT/INR No results for input(s): LABPROT, INR in the last 72 hours. ABG No results for input(s): PHART, HCO3 in the last 72 hours.  Invalid input(s): PCO2, PO2  Studies/Results: DG OR UROLOGY CYSTO IMAGE ([facility] ONLY)  Result Date: 06/28/2020 There is no interpretation for this exam.  This order is for images obtained during a surgical procedure.  Please See "Surgeries" Tab for more  information regarding the procedure.   CT Renal Stone Study  Result Date: 06/28/2020 CLINICAL DATA:  49 year old male with LEFT abdominal and pelvic pain. EXAM: CT ABDOMEN AND PELVIS WITHOUT CONTRAST TECHNIQUE: Multidetector CT imaging of the abdomen and pelvis was performed following the standard protocol without IV contrast. COMPARISON:  02/04/2019 CT and prior studies FINDINGS: Please note that parenchymal abnormalities may be missed without intravenous contrast. Lower chest: No acute abnormality. Hepatobiliary: Hepatic steatosis noted without definite focal hepatic abnormality. Gallbladder is unremarkable. No biliary dilatation. Pancreas: Unremarkable Spleen: Unremarkable Adrenals/Urinary Tract: A 7 mm proximal LEFT ureteral calculus causes mild LEFT hydronephrosis and perinephric inflammation. A nonobstructing 2 mm mid LEFT renal calculus and a nonobstructing 4 mm LOWER LEFT renal calculus are identified. The RIGHT kidney, adrenal glands and bladder are unremarkable. Stomach/Bowel: A small hiatal hernia is noted. There is no evidence of bowel obstruction, definite bowel wall thickening or inflammatory changes. Bowel surgical changes are again identified. The appendix is normal. Vascular/Lymphatic: No significant vascular findings are present. No enlarged abdominal or pelvic lymph nodes. Reproductive: Prostate is unremarkable. Other: No ascites, focal collection or pneumoperitoneum. Musculoskeletal: No acute or suspicious bony abnormalities. IMPRESSION: 1. 7 mm proximal LEFT ureteral calculus causing mild LEFT hydronephrosis. 2. Nonobstructing LEFT renal calculi. 3. Hepatic steatosis. 4. Small hiatal hernia. Electronically Signed   By: Margarette Canada M.D.   On: 06/28/2020 06:35     Assessment and Plan: Left proximal stone with proximal ureteral stricture with persistent severe stent colic.   I had previously added B&O suppositories and will add tamsulosin and pyridium and keep him for pain control.   If  the pain doesn't improve, he will need ureteroscopy or ESWL, this week if possible.        LOS: 0 days    Irine Seal 06/29/2020 [phone] ID: Dorena Cookey, male   DOB: 1971-03-25, 49 y.o.   MRN: [id number]

## 2020-06-30 ENCOUNTER — Other Ambulatory Visit: Payer: Self-pay | Admitting: Radiology

## 2020-06-30 DIAGNOSIS — N201 Calculus of ureter: Secondary | ICD-10-CM

## 2020-06-30 DIAGNOSIS — N200 Calculus of kidney: Secondary | ICD-10-CM

## 2020-06-30 LAB — URINALYSIS, COMPLETE (UACMP) WITH MICROSCOPIC
Bilirubin Urine: NEGATIVE
Glucose, UA: NEGATIVE mg/dL
Ketones, ur: NEGATIVE mg/dL
Nitrite: POSITIVE — AB
Protein, ur: 100 mg/dL — AB
RBC / HPF: >50 RBC/hpf — ABNORMAL HIGH (ref 0–5)
Specific Gravity, Urine: 1.016 (ref 1.005–1.030)
Squamous Epithelial / HPF: NONE SEEN (ref 0–5)
WBC, UA: >50 WBC/hpf — ABNORMAL HIGH (ref 0–5)
pH: 5 (ref 5.0–8.0)

## 2020-06-30 MED ORDER — PHENAZOPYRIDINE HCL 200 MG PO TABS: 200.0000 mg | ORAL_TABLET | Freq: Three times a day (TID) | ORAL | 0 refills | Status: DC | PRN

## 2020-06-30 MED ORDER — OXYBUTYNIN CHLORIDE 5 MG PO TABS: 5.0000 mg | ORAL_TABLET | Freq: Three times a day (TID) | ORAL | 1 refills | Status: DC | PRN

## 2020-06-30 MED ORDER — TAMSULOSIN HCL 0.4 MG PO CAPS: 0.4000 mg | ORAL_CAPSULE | Freq: Every day | ORAL | 0 refills | Status: DC

## 2020-06-30 MED ORDER — OXYCODONE-ACETAMINOPHEN 5-325 MG PO TABS
1.0000 | ORAL_TABLET | Freq: Four times a day (QID) | ORAL | 0 refills | Status: DC | PRN
Start: 2020-06-30 — End: 2020-07-07

## 2020-06-30 NOTE — Progress Notes (Addendum)
Urology Inpatient Progress Note  Subjective: No acute events overnight. Patient reports continued severe dysuria with lingering LLQ pain lasting up to an hour after voiding.  He has had gross hematuria as well.  He states overall, his pain is better controlled and slightly improving compared to prior.  His pain regimen currently includes Tylenol, B&O suppositories, oxybutynin, oxycodone, and Pyridium.  He has been requiring oxycodone 10 mg every 4-6 hours for the past 2 days. Patient is based in [place], however his wife is currently working as a travel Marine scientist in [place].  He intends to travel back to [place] tomorrow for the Thanksgiving holiday.  He plans to stay in [place] for approximately 3 weeks for returning to [place].  Anti-infectives: Anti-infectives (From admission, onward)   None      Current Facility-Administered Medications  Medication Dose Route Frequency Provider Last Rate Last Admin  . 0.9 %  sodium chloride infusion  250 mL Intravenous PRN Irine Seal, MD      . acetaminophen (TYLENOL) tablet 650 mg  650 mg Oral Q4H PRN Irine Seal, MD   650 mg at 06/30/20 9381   Or  . acetaminophen (TYLENOL) suppository 650 mg  650 mg Rectal Q4H PRN Irine Seal, MD      . DULoxetine (CYMBALTA) DR capsule 30 mg  30 mg Oral Daily Irine Seal, MD   30 mg at 06/30/20 0817  . HYDROmorphone (DILAUDID) injection 1 mg  1 mg Intravenous Q2H PRN Irine Seal, MD   1 mg at 06/29/20 1734  . lamoTRIgine (LAMICTAL) tablet 100 mg  100 mg Oral Daily Irine Seal, MD   100 mg at 06/30/20 0819  . metoprolol succinate (TOPROL-XL) 24 hr tablet 25 mg  25 mg Oral Daily Irine Seal, MD   25 mg at 06/30/20 0817  . morphine 2 MG/ML injection 2 mg  2 mg Intravenous Q2H PRN Irine Seal, MD   2 mg at 06/28/20 2250  . ondansetron (ZOFRAN) 8 mg in sodium chloride 0.9 % 50 mL IVPB  8 mg Intravenous Q8H PRN Irine Seal, MD      . opium-belladonna (B&O) suppository 16.2-60mg   1 suppository Rectal Q6H PRN Irine Seal, MD   1  suppository at 06/29/20 1317  . oxybutynin (DITROPAN) tablet 5 mg  5 mg Oral TID PRN Irine Seal, MD   5 mg at 06/30/20 0615  . oxyCODONE (Oxy IR/ROXICODONE) immediate release tablet 5-10 mg  5-10 mg Oral Q4H PRN Irine Seal, MD   10 mg at 06/30/20 0615  . pantoprazole (PROTONIX) EC tablet 40 mg  40 mg Oral BID Irine Seal, MD   40 mg at 06/30/20 0818  . phenazopyridine (PYRIDIUM) tablet 200 mg  200 mg Oral TID PRN Irine Seal, MD   200 mg at 06/30/20 0817  . rosuvastatin (CRESTOR) tablet 5 mg  5 mg Oral Daily Irine Seal, MD   5 mg at 06/30/20 0817  . sodium chloride flush (NS) 0.9 % injection 3 mL  3 mL Intravenous Q12H Irine Seal, MD   3 mL at 06/29/20 1110  . sodium chloride flush (NS) 0.9 % injection 3 mL  3 mL Intravenous PRN Irine Seal, MD      . tamsulosin [facility]) capsule 0.4 mg  0.4 mg Oral Daily Irine Seal, MD   0.4 mg at 06/30/20 0817  . tiZANidine (ZANAFLEX) tablet 4 mg  4 mg Oral QHS Irine Seal, MD   4 mg at 06/29/20 2138  . traZODone (DESYREL) tablet 200 mg  200 mg Oral QHS Irine Seal, MD   200 mg at 06/29/20 2138     Objective: Vital signs in last 24 hours: Temp:  [97.5 F (36.4 C)-98.2 F (36.8 C)] 97.6 F (36.4 C) (11/22 0810) Pulse Rate:  [66-104] 66 (11/22 0810) Resp:  [17-20] 20 (11/22 0810) BP: (109-164)/(69-110) 122/95 (11/22 0810) SpO2:  [94 %-99 %] 98 % (11/22 0810)  Intake/Output from previous day: 11/21 0701 - 11/22 0700 In: 240 [P.O.:240] Out: 1975 [Urine:1975] Intake/Output this shift: No intake/output data recorded.  Physical Exam Vitals and nursing note reviewed.  Constitutional:      General: He is not in acute distress.    Appearance: He is not ill-appearing, toxic-appearing or diaphoretic.  HENT:     Head: Normocephalic and atraumatic.  Pulmonary:     Effort: Pulmonary effort is normal. No respiratory distress.  Skin:    General: Skin is warm and dry.  Neurological:     Mental Status: He is alert and oriented to person, place, and  time.  Psychiatric:        Mood and Affect: Mood normal.        Behavior: Behavior normal.    Lab Results:  Recent Labs    06/28/20 0429  WBC 8.0  HGB 16.3  HCT 47.5  PLT 260   BMET Recent Labs    06/28/20 0429  NA 140  K 4.3  CL 104  CO2 27  GLUCOSE 113*  BUN 13  CREATININE 1.30*  CALCIUM 9.5   Assessment & Plan: 49 year old male with a 7 mm left proximal ureteral stone and proximal ureteral stricture now POD 2 from left ureteral stent placement with Dr. Jeffie Pollock.  Discharge has been delayed due to stent discomfort.  Stent symptoms better managed today, however patient remains symptomatic.  He has been requiring 10 mg oxycodone every 4-6 hours.  I explained to the patient that it would not be safe to keep him on this dose of opioids for up to 3 weeks while we await his return to [place] for definitive stone management.  Based on this, he states he is able to return as soon as 1 week from today for treatment.  Additionally, I explained my concern that his stent discomfort may become uncontrollable again upon discharge given that he is not expected to be able to access B&O suppositories on an outpatient basis.  He understands this risk and would still like to be discharged today with plans to travel out of state tomorrow.  We discussed various treatment options for his stone including ESWL versus ureteroscopy with laser lithotripsy and stent exchange.  We discussed the risks and benefits of these procedures including discomfort associated with stone passage with ESWL as well as continued stent discomfort associated with the stent exchange.  I explained that stone clearance may be complicated if his ureteral stricture does not dilate as expected around the ureteral stent in place.  He expressed understanding and is willing to proceed with definitive stone management.  Per Dr. Cherrie Gauze recommendations, we will plan for left URS/LL/stent exchange for treatment of his left proximal  ureteral stone as well as nonobstructing left nephrolithiasis on 07/07/2020. Preop UA and culture to be obtained today prior to discharge.  We discussed return precautions while he is in [place] including uncontrollable pain, fever, chills, nausea, and vomiting.  I counseled him to proceed to the emergency department immediately if he develops any of these.  He expressed understanding.  Will plan for discharge  today with plans for definitive stone management (left URS/LL/stent exchange) with Dr. Erlene Quan on 07/07/2020.  Debroah Loop, PA-C 06/30/2020

## 2020-06-30 NOTE — Discharge Summary (Signed)
Date of admission: 06/28/2020  Date of discharge: 06/30/2020  Admission diagnosis: 7mm proximal left ureteral stone  Discharge diagnosis: 7mm proximal left ureteral stone, left ureteral stricture, stent discomfort  Secondary diagnoses:  Patient Active Problem List   Diagnosis Date Noted  . Left ureteral stone 06/28/2020  . Shortness of breath 12/14/2016  . Hypertension 12/14/2016  . Chest pain 12/14/2016  . Family history of premature CAD 12/14/2016  . Morbid obesity (HCC) 12/14/2016  . Anxiety 12/14/2016  . Dizziness 12/14/2016    History and Physical: For full details, please see admission history and physical. Briefly, Jonathan Aguirre is a 49 y.o. year old patient admitted on 06/28/2020 with left renal colic associated with a 7 mm proximal left ureteral stone.  He underwent cystoscopy and left ureteroscopy with left ureteral stent placement with Dr. Wrenn on 06/28/2020 with intraoperative findings notable for a proximal left ureteral stricture.   Hospital Course: Patient tolerated the procedure well.  He was then transferred to the floor after an uneventful PACU stay.  His hospital course was prolonged secondary to stent discomfort requiring multiple medications to manage his pain.  On POD#2 he had met discharge criteria: was eating a regular diet, was up and ambulating independently,  pain was well controlled, was voiding without a catheter, and was ready for discharge.  Laboratory values:  Recent Labs    06/28/20 0429  WBC 8.0  HGB 16.3  HCT 47.5   Recent Labs    06/28/20 0429  NA 140  K 4.3  CL 104  CO2 27  GLUCOSE 113*  BUN 13  CREATININE 1.30*  CALCIUM 9.5   Results for orders placed or performed during the hospital encounter of 06/28/20  Resp Panel by RT-PCR (Flu A&B, Covid)     Status: None   Collection Time: 06/28/20  1:31 PM   Specimen: Nasopharyngeal(NP) swabs in vial transport medium  Result Value Ref Range Status   SARS Coronavirus 2 by RT PCR  NEGATIVE NEGATIVE Final    Comment: (NOTE) SARS-CoV-2 target nucleic acids are NOT DETECTED.  The SARS-CoV-2 RNA is generally detectable in upper respiratory specimens during the acute phase of infection. The lowest concentration of SARS-CoV-2 viral copies this assay can detect is 138 copies/mL. A negative result does not preclude SARS-Cov-2 infection and should not be used as the sole basis for treatment or other patient management decisions. A negative result may occur with  improper specimen collection/handling, submission of specimen other than nasopharyngeal swab, presence of viral mutation(s) within the areas targeted by this assay, and inadequate number of viral copies(<138 copies/mL). A negative result must be combined with clinical observations, patient history, and epidemiological information. The expected result is Negative.  Fact Sheet for Patients:  https://www.fda.gov/media/152166/download  Fact Sheet for Healthcare Providers:  https://www.fda.gov/media/152162/download  This test is no t yet approved or cleared by the [place] FDA and  has been authorized for detection and/or diagnosis of SARS-CoV-2 by FDA under an Emergency Use Authorization (EUA). This EUA will remain  in effect (meaning this test can be used) for the duration of the COVID-19 declaration under Section 564(b)(1) of the Act, 21 U.S.C.section 360bbb-3(b)(1), unless the authorization is terminated  or revoked sooner.       Influenza A by PCR NEGATIVE NEGATIVE Final   Influenza B by PCR NEGATIVE NEGATIVE Final    Comment: (NOTE) The Xpert Xpress SARS-CoV-2/FLU/RSV plus assay is intended as an aid in the diagnosis of influenza from Nasopharyngeal swab specimens and should   not be used as a sole basis for treatment. Nasal washings and aspirates are unacceptable for Xpert Xpress SARS-CoV-2/FLU/RSV testing.  Fact Sheet for Patients: https://www.fda.gov/media/152166/download  Fact Sheet for  Healthcare Providers: https://www.fda.gov/media/152162/download  This test is not yet approved or cleared by the [place] FDA and has been authorized for detection and/or diagnosis of SARS-CoV-2 by FDA under an Emergency Use Authorization (EUA). This EUA will remain in effect (meaning this test can be used) for the duration of the COVID-19 declaration under Section 564(b)(1) of the Act, 21 U.S.C. section 360bbb-3(b)(1), unless the authorization is terminated or revoked.  Performed at [facility], [address] [place], [place] [address]    Disposition: Home  Discharge instruction: The patient was instructed to be ambulatory but told to refrain from heavy lifting, strenuous activity, or driving.  He was counseled to return to the emergency department with uncontrollable pain, fever, chills, nausea, or vomiting.  Discharge medications:  Allergies as of 06/30/2020   No Known Allergies     Medication List    TAKE these medications   cyclobenzaprine 10 MG tablet Commonly known as: FLEXERIL Take 10 mg by mouth daily as needed for muscle spasms.   DULoxetine 30 MG capsule Commonly known as: CYMBALTA Take 30 mg by mouth daily.   hydrOXYzine 50 MG tablet Commonly known as: ATARAX/VISTARIL Take 50 mg by mouth every 8 (eight) hours as needed for anxiety.   lamoTRIgine 100 MG tablet Commonly known as: LAMICTAL Take 100 mg by mouth daily.   metoprolol succinate 25 MG 24 hr tablet Commonly known as: TOPROL-XL Take 25 mg by mouth daily.   multivitamin tablet Take 1 tablet by mouth daily.   omeprazole 20 MG capsule Commonly known as: PRILOSEC Take 20 mg by mouth 2 (two) times daily before a meal.   ondansetron 4 MG disintegrating tablet Commonly known as: Zofran ODT Take 1 tablet (4 mg total) by mouth every 8 (eight) hours as needed for nausea or vomiting.   oxybutynin 5 MG tablet Commonly known as: DITROPAN Take 1 tablet (5 mg total) by mouth every 8  (eight) hours as needed for bladder spasms.   oxyCODONE-acetaminophen 5-325 MG tablet Commonly known as: Percocet Take 1-2 tablets by mouth every 6 (six) hours as needed for severe pain.   phenazopyridine 200 MG tablet Commonly known as: PYRIDIUM Take 1 tablet (200 mg total) by mouth 3 (three) times daily as needed (bladder pain).   rosuvastatin 5 MG tablet Commonly known as: CRESTOR Take 1 tablet (5 mg total) by mouth daily.   tamsulosin 0.4 MG Caps capsule Commonly known as: FLOMAX Take 1 capsule (0.4 mg total) by mouth daily. Start taking on: July 01, 2020   tiZANidine 4 MG tablet Commonly known as: ZANAFLEX Take 4 mg by mouth at bedtime.   traZODone 100 MG tablet Commonly known as: DESYREL Take 200 mg by mouth at bedtime.      Followup:   Follow-up Information    [facility]. Go today.   Why: To complete preoperative paperwork.              I spent 60 minutes on the day of discharge to include final exam, preparation of discharge records, and coordination of care. 

## 2020-06-30 NOTE — H&P (View-Only) (Signed)
Urology Inpatient Progress Note  Subjective: No acute events overnight. Patient reports continued severe dysuria with lingering LLQ pain lasting up to an hour after voiding.  He has had gross hematuria as well.  He states overall, his pain is better controlled and slightly improving compared to prior.  His pain regimen currently includes Tylenol, B&O suppositories, oxybutynin, oxycodone, and Pyridium.  He has been requiring oxycodone 10 mg every 4-6 hours for the past 2 days. Patient is based in [place], however his wife is currently working as a travel Marine scientist in [place].  He intends to travel back to [place] tomorrow for the Thanksgiving holiday.  He plans to stay in [place] for approximately 3 weeks for returning to [facility].  Anti-infectives: Anti-infectives (From admission, onward)   None      Current Facility-Administered Medications  Medication Dose Route Frequency Provider Last Rate Last Admin  . 0.9 %  sodium chloride infusion  250 mL Intravenous PRN Irine Seal, MD      . acetaminophen (TYLENOL) tablet 650 mg  650 mg Oral Q4H PRN Irine Seal, MD   650 mg at 06/30/20 5170   Or  . acetaminophen (TYLENOL) suppository 650 mg  650 mg Rectal Q4H PRN Irine Seal, MD      . DULoxetine (CYMBALTA) DR capsule 30 mg  30 mg Oral Daily Irine Seal, MD   30 mg at 06/30/20 0817  . HYDROmorphone (DILAUDID) injection 1 mg  1 mg Intravenous Q2H PRN Irine Seal, MD   1 mg at 06/29/20 1734  . lamoTRIgine (LAMICTAL) tablet 100 mg  100 mg Oral Daily Irine Seal, MD   100 mg at 06/30/20 0819  . metoprolol succinate (TOPROL-XL) 24 hr tablet 25 mg  25 mg Oral Daily Irine Seal, MD   25 mg at 06/30/20 0817  . morphine 2 MG/ML injection 2 mg  2 mg Intravenous Q2H PRN Irine Seal, MD   2 mg at 06/28/20 2250  . ondansetron (ZOFRAN) 8 mg in sodium chloride 0.9 % 50 mL IVPB  8 mg Intravenous Q8H PRN Irine Seal, MD      . opium-belladonna (B&O) suppository 16.2-60mg   1 suppository Rectal Q6H PRN Irine Seal, MD   1  suppository at 06/29/20 1317  . oxybutynin (DITROPAN) tablet 5 mg  5 mg Oral TID PRN Irine Seal, MD   5 mg at 06/30/20 0615  . oxyCODONE (Oxy IR/ROXICODONE) immediate release tablet 5-10 mg  5-10 mg Oral Q4H PRN Irine Seal, MD   10 mg at 06/30/20 0615  . pantoprazole (PROTONIX) EC tablet 40 mg  40 mg Oral BID Irine Seal, MD   40 mg at 06/30/20 0818  . phenazopyridine (PYRIDIUM) tablet 200 mg  200 mg Oral TID PRN Irine Seal, MD   200 mg at 06/30/20 0817  . rosuvastatin (CRESTOR) tablet 5 mg  5 mg Oral Daily Irine Seal, MD   5 mg at 06/30/20 0817  . sodium chloride flush (NS) 0.9 % injection 3 mL  3 mL Intravenous Q12H Irine Seal, MD   3 mL at 06/29/20 1110  . sodium chloride flush (NS) 0.9 % injection 3 mL  3 mL Intravenous PRN Irine Seal, MD      . tamsulosin [facility]) capsule 0.4 mg  0.4 mg Oral Daily Irine Seal, MD   0.4 mg at 06/30/20 0817  . tiZANidine (ZANAFLEX) tablet 4 mg  4 mg Oral QHS Irine Seal, MD   4 mg at 06/29/20 2138  . traZODone (DESYREL) tablet 200 mg  200 mg Oral QHS Irine Seal, MD   200 mg at 06/29/20 2138     Objective: Vital signs in last 24 hours: Temp:  [97.5 F (36.4 C)-98.2 F (36.8 C)] 97.6 F (36.4 C) (11/22 0810) Pulse Rate:  [66-104] 66 (11/22 0810) Resp:  [17-20] 20 (11/22 0810) BP: (109-164)/(69-110) 122/95 (11/22 0810) SpO2:  [94 %-99 %] 98 % (11/22 0810)  Intake/Output from previous day: 11/21 0701 - 11/22 0700 In: 240 [P.O.:240] Out: 1975 [Urine:1975] Intake/Output this shift: No intake/output data recorded.  Physical Exam Vitals and nursing note reviewed.  Constitutional:      General: He is not in acute distress.    Appearance: He is not ill-appearing, toxic-appearing or diaphoretic.  HENT:     Head: Normocephalic and atraumatic.  Pulmonary:     Effort: Pulmonary effort is normal. No respiratory distress.  Skin:    General: Skin is warm and dry.  Neurological:     Mental Status: He is alert and oriented to person, place, and  time.  Psychiatric:        Mood and Affect: Mood normal.        Behavior: Behavior normal.    Lab Results:  Recent Labs    06/28/20 0429  WBC 8.0  HGB 16.3  HCT 47.5  PLT 260   BMET Recent Labs    06/28/20 0429  NA 140  K 4.3  CL 104  CO2 27  GLUCOSE 113*  BUN 13  CREATININE 1.30*  CALCIUM 9.5   Assessment & Plan: 49 year old male with a 7 mm left proximal ureteral stone and proximal ureteral stricture now POD 2 from left ureteral stent placement with Dr. Jeffie Pollock.  Discharge has been delayed due to stent discomfort.  Stent symptoms better managed today, however patient remains symptomatic.  He has been requiring 10 mg oxycodone every 4-6 hours.  I explained to the patient that it would not be safe to keep him on this dose of opioids for up to 3 weeks while we await his return to [place] for definitive stone management.  Based on this, he states he is able to return as soon as 1 week from today for treatment.  Additionally, I explained my concern that his stent discomfort may become uncontrollable again upon discharge given that he is not expected to be able to access B&O suppositories on an outpatient basis.  He understands this risk and would still like to be discharged today with plans to travel out of state tomorrow.  We discussed various treatment options for his stone including ESWL versus ureteroscopy with laser lithotripsy and stent exchange.  We discussed the risks and benefits of these procedures including discomfort associated with stone passage with ESWL as well as continued stent discomfort associated with the stent exchange.  I explained that stone clearance may be complicated if his ureteral stricture does not dilate as expected around the ureteral stent in place.  He expressed understanding and is willing to proceed with definitive stone management.  Per Dr. Cherrie Gauze recommendations, we will plan for left URS/LL/stent exchange for treatment of his left proximal  ureteral stone as well as nonobstructing left nephrolithiasis on 07/07/2020. Preop UA and culture to be obtained today prior to discharge.  We discussed return precautions while he is in [place] including uncontrollable pain, fever, chills, nausea, and vomiting.  I counseled him to proceed to the emergency department immediately if he develops any of these.  He expressed understanding.  Will plan for discharge  today with plans for definitive stone management (left URS/LL/stent exchange) with Dr. Erlene Quan on 07/07/2020.  Debroah Loop, PA-C 06/30/2020

## 2020-06-30 NOTE — Plan of Care (Signed)
Discharge order received. Patient mental status is at baseline. Vital signs stable . No signs of acute distress. Discharge instructions given. Patient verbalized understanding. No other issues noted at this time.   

## 2020-06-30 NOTE — Care Management Obs Status (Signed)
[facility] NOTIFICATION   Patient Details  Name: JAISHON KRISHER MRN: [id number] Date of Birth: 30-Jul-1971   Medicare Observation Status Notification Given:  Yes    Candie Chroman, LCSW 06/30/2020, 9:26 AM

## 2020-06-30 NOTE — Discharge Instructions (Signed)
If you develop uncontrolled pain, fever, chills, nausea, or vomiting in the days leading up to your scheduled surgery on 07/07/2020, please go to the Emergency Department immediately.  Ureteral Stent Implantation, Care After This sheet gives you information about how to care for yourself after your procedure. Your health care provider may also give you more specific instructions. If you have problems or questions, contact your health care provider. What can I expect after the procedure? After the procedure, it is common to have:  Nausea.  Mild pain when you urinate. You may feel this pain in your lower back or lower abdomen. The pain should stop within a few minutes after you urinate. This may last for up to 1 week.  A small amount of blood in your urine for several days. Follow these instructions at home: Medicines  Take over-the-counter and prescription medicines only as told by your health care provider.  If you were prescribed an antibiotic medicine, take it as told by your health care provider. Do not stop taking the antibiotic even if you start to feel better.  Do not drive for 24 hours if you were given a sedative during your procedure.  Ask your health care provider if the medicine prescribed to you requires you to avoid driving or using heavy machinery. Activity  Rest as told by your health care provider.  Avoid sitting for a long time without moving. Get up to take short walks every 1-2 hours. This is important to improve blood flow and breathing. Ask for help if you feel weak or unsteady.  Return to your normal activities as told by your health care provider. Ask your health care provider what activities are safe for you. General instructions   Watch for any blood in your urine. Call your health care provider if the amount of blood in your urine increases.  If you have a catheter: ? Follow instructions from your health care provider about taking care of your catheter and  collection bag. ? Do not take baths, swim, or use a hot tub until your health care provider approves. Ask your health care provider if you may take showers. You may only be allowed to take sponge baths.  Drink enough fluid to keep your urine pale yellow.  Do not use any products that contain nicotine or tobacco, such as cigarettes, e-cigarettes, and chewing tobacco. These can delay healing after surgery. If you need help quitting, ask your health care provider.  Keep all follow-up visits as told by your health care provider. This is important. Contact a health care provider if:  You have pain that gets worse or does not get better with medicine, especially pain when you urinate.  You have difficulty urinating.  You feel nauseous or you vomit repeatedly during a period of more than 2 days after the procedure. Get help right away if:  Your urine is dark red or has blood clots in it.  You are leaking urine (have incontinence).  The end of the stent comes out of your urethra.  You cannot urinate.  You have sudden, sharp, or severe pain in your abdomen or lower back.  You have a fever.  You have swelling or pain in your legs.  You have difficulty breathing. Summary  After the procedure, it is common to have mild pain when you urinate that goes away within a few minutes after you urinate. This may last for up to 1 week.  Watch for any blood in your urine. Call  your health care provider if the amount of blood in your urine increases.  Take over-the-counter and prescription medicines only as told by your health care provider.  Drink enough fluid to keep your urine pale yellow. This information is not intended to replace advice given to you by your health care provider. Make sure you discuss any questions you have with your health care provider. Document Revised: 05/02/2018 Document Reviewed: 05/03/2018 Elsevier Patient Education  2020 [employer].

## 2020-07-01 LAB — URINE CULTURE: Culture: NO GROWTH

## 2020-07-04 ENCOUNTER — Other Ambulatory Visit: Payer: PPO

## 2020-07-04 ENCOUNTER — Encounter: Payer: Self-pay | Admitting: Urology

## 2020-07-04 NOTE — OR Nursing (Signed)
Pt was contacted for a friendly reminder to come on the day of surgery, Monday November 29 th at 12:00 pm and also to take the following medications on the morning of surgery with a little sip of water, the medications are as follow: metoprolol, cymbalta, lamictal, prilosec, flomax, and oxybultyning. Pt verbalized fully understanding. Continue to monitor

## 2020-07-07 ENCOUNTER — Ambulatory Visit: Payer: PPO

## 2020-07-07 ENCOUNTER — Ambulatory Visit: Payer: PPO | Admitting: Anesthesiology

## 2020-07-07 ENCOUNTER — Other Ambulatory Visit
Admission: RE | Admit: 2020-07-07 | Discharge: 2020-07-07 | Disposition: A | Discharge status: Home / Self Care | Payer: PPO | Source: Ambulatory Visit | Attending: Urology | Admitting: Urology

## 2020-07-07 ENCOUNTER — Other Ambulatory Visit: Payer: Self-pay

## 2020-07-07 ENCOUNTER — Ambulatory Visit
Admission: RE | Admit: 2020-07-07 | Discharge: 2020-07-07 | Disposition: A | Discharge status: Home / Self Care | Payer: PPO | Attending: Urology | Admitting: Urology

## 2020-07-07 ENCOUNTER — Encounter
Admission: RE | Disposition: A | Discharge status: Home / Self Care | Payer: Self-pay | Source: Home / Self Care | Attending: Urology

## 2020-07-07 ENCOUNTER — Encounter: Payer: Self-pay | Admitting: Urology

## 2020-07-07 DIAGNOSIS — Z20822 Contact with and (suspected) exposure to covid-19: Secondary | ICD-10-CM | POA: Diagnosis not present

## 2020-07-07 DIAGNOSIS — N202 Calculus of kidney with calculus of ureter: Secondary | ICD-10-CM | POA: Insufficient documentation

## 2020-07-07 DIAGNOSIS — N201 Calculus of ureter: Secondary | ICD-10-CM

## 2020-07-07 DIAGNOSIS — I10 Essential (primary) hypertension: Secondary | ICD-10-CM | POA: Diagnosis not present

## 2020-07-07 DIAGNOSIS — N132 Hydronephrosis with renal and ureteral calculous obstruction: Secondary | ICD-10-CM | POA: Diagnosis not present

## 2020-07-07 DIAGNOSIS — N200 Calculus of kidney: Secondary | ICD-10-CM

## 2020-07-07 HISTORY — PX: CYSTOSCOPY/URETEROSCOPY/HOLMIUM LASER/STENT PLACEMENT: SHX6546

## 2020-07-07 LAB — SARS CORONAVIRUS 2 BY RT PCR (HOSPITAL ORDER, PERFORMED IN ~~LOC~~ HOSPITAL LAB): SARS Coronavirus 2: NEGATIVE

## 2020-07-07 SURGERY — CYSTOSCOPY/URETEROSCOPY/HOLMIUM LASER/STENT PLACEMENT
Anesthesia: General | Laterality: Left

## 2020-07-07 MED ORDER — FENTANYL CITRATE (PF) 100 MCG/2ML IJ SOLN
INTRAMUSCULAR | Status: AC
  Filled 2020-07-07: qty 2

## 2020-07-07 MED ORDER — OXYBUTYNIN CHLORIDE 5 MG PO TABS
ORAL_TABLET | ORAL | Status: AC
  Administered 2020-07-07: 5 mg
  Filled 2020-07-07: qty 1

## 2020-07-07 MED ORDER — PROPOFOL 10 MG/ML IV BOLUS
INTRAVENOUS | Status: AC
  Filled 2020-07-07: qty 20

## 2020-07-07 MED ORDER — ONDANSETRON HCL 4 MG/2ML IJ SOLN
INTRAMUSCULAR | Status: DC | PRN
  Administered 2020-07-07: 4 mg via INTRAVENOUS

## 2020-07-07 MED ORDER — OXYCODONE-ACETAMINOPHEN 5-325 MG PO TABS
ORAL_TABLET | ORAL | Status: AC
  Filled 2020-07-07: qty 1

## 2020-07-07 MED ORDER — OXYCODONE-ACETAMINOPHEN 5-325 MG PO TABS
1.0000 | ORAL_TABLET | Freq: Four times a day (QID) | ORAL | 0 refills | Status: AC | PRN
Start: 2020-07-07 — End: 2021-07-07

## 2020-07-07 MED ORDER — BELLADONNA ALKALOIDS-OPIUM 16.2-60 MG RE SUPP
RECTAL | Status: AC
  Filled 2020-07-07: qty 1

## 2020-07-07 MED ORDER — PROMETHAZINE HCL 25 MG/ML IJ SOLN: 6.2500 mg | INTRAMUSCULAR | Status: DC | PRN

## 2020-07-07 MED ORDER — CEFAZOLIN SODIUM-DEXTROSE 2-4 GM/100ML-% IV SOLN
2.0000 g | INTRAVENOUS | Status: AC
  Administered 2020-07-07: 2 g via INTRAVENOUS

## 2020-07-07 MED ORDER — LACTATED RINGERS IV SOLN: INTRAVENOUS | Status: DC

## 2020-07-07 MED ORDER — ROCURONIUM BROMIDE 100 MG/10ML IV SOLN
INTRAVENOUS | Status: DC | PRN
  Administered 2020-07-07: 20 mg via INTRAVENOUS
  Administered 2020-07-07: 60 mg via INTRAVENOUS

## 2020-07-07 MED ORDER — OXYBUTYNIN CHLORIDE 5 MG PO TABS
5.0000 mg | ORAL_TABLET | Freq: Three times a day (TID) | ORAL | 1 refills | Status: DC | PRN
Start: 2020-07-07 — End: 2022-11-21

## 2020-07-07 MED ORDER — OXYCODONE-ACETAMINOPHEN 5-325 MG PO TABS
1.0000 | ORAL_TABLET | Freq: Once | ORAL | Status: AC
  Administered 2020-07-07: 1 via ORAL

## 2020-07-07 MED ORDER — OXYBUTYNIN CHLORIDE 5 MG PO TABS
5.0000 mg | ORAL_TABLET | Freq: Once | ORAL | Status: AC
  Filled 2020-07-07: qty 1

## 2020-07-07 MED ORDER — BELLADONNA ALKALOIDS-OPIUM 16.2-60 MG RE SUPP: 1.0000 | Freq: Once | RECTAL | Status: DC

## 2020-07-07 MED ORDER — KETOROLAC TROMETHAMINE 30 MG/ML IJ SOLN
INTRAMUSCULAR | Status: AC
  Administered 2020-07-07: 30 mg
  Filled 2020-07-07: qty 1

## 2020-07-07 MED ORDER — CHLORHEXIDINE GLUCONATE 0.12 % MT SOLN
OROMUCOSAL | Status: AC
  Administered 2020-07-07: 15 mL via OROMUCOSAL
  Filled 2020-07-07: qty 15

## 2020-07-07 MED ORDER — LIDOCAINE HCL (PF) 2 % IJ SOLN
INTRAMUSCULAR | Status: AC
  Filled 2020-07-07: qty 5

## 2020-07-07 MED ORDER — HYDROMORPHONE HCL 1 MG/ML IJ SOLN
INTRAMUSCULAR | Status: AC
  Filled 2020-07-07: qty 1

## 2020-07-07 MED ORDER — ACETAMINOPHEN 10 MG/ML IV SOLN
INTRAVENOUS | Status: AC
  Filled 2020-07-07: qty 100

## 2020-07-07 MED ORDER — MIDAZOLAM HCL 2 MG/2ML IJ SOLN
INTRAMUSCULAR | Status: AC
  Filled 2020-07-07: qty 2

## 2020-07-07 MED ORDER — MIDAZOLAM HCL 2 MG/2ML IJ SOLN
INTRAMUSCULAR | Status: DC | PRN
  Administered 2020-07-07: 2 mg via INTRAVENOUS

## 2020-07-07 MED ORDER — PROPOFOL 10 MG/ML IV BOLUS: INTRAVENOUS | Status: DC | PRN

## 2020-07-07 MED ORDER — OXYCODONE HCL 5 MG/5ML PO SOLN: 5.0000 mg | Freq: Once | ORAL | Status: AC | PRN

## 2020-07-07 MED ORDER — KETOROLAC TROMETHAMINE 30 MG/ML IJ SOLN: 30.0000 mg | Freq: Once | INTRAMUSCULAR | Status: DC

## 2020-07-07 MED ORDER — FENTANYL CITRATE (PF) 100 MCG/2ML IJ SOLN
INTRAMUSCULAR | Status: DC | PRN
  Administered 2020-07-07 (×2): 50 ug via INTRAVENOUS
  Administered 2020-07-07: 25 ug via INTRAVENOUS

## 2020-07-07 MED ORDER — PROPOFOL 10 MG/ML IV BOLUS
INTRAVENOUS | Status: DC | PRN
  Administered 2020-07-07: 200 mg via INTRAVENOUS

## 2020-07-07 MED ORDER — CHLORHEXIDINE GLUCONATE 0.12 % MT SOLN: 15.0000 mL | Freq: Once | OROMUCOSAL | Status: AC

## 2020-07-07 MED ORDER — ORAL CARE MOUTH RINSE: 15.0000 mL | Freq: Once | OROMUCOSAL | Status: AC

## 2020-07-07 MED ORDER — SUGAMMADEX SODIUM 500 MG/5ML IV SOLN
INTRAVENOUS | Status: DC | PRN
  Administered 2020-07-07: 400 mg via INTRAVENOUS

## 2020-07-07 MED ORDER — MEPERIDINE HCL 50 MG/ML IJ SOLN: 6.2500 mg | INTRAMUSCULAR | Status: DC | PRN

## 2020-07-07 MED ORDER — OXYCODONE HCL 5 MG PO TABS
ORAL_TABLET | ORAL | Status: AC
  Filled 2020-07-07: qty 1

## 2020-07-07 MED ORDER — LIDOCAINE HCL (CARDIAC) PF 100 MG/5ML IV SOSY
PREFILLED_SYRINGE | INTRAVENOUS | Status: DC | PRN
  Administered 2020-07-07: 80 mg via INTRAVENOUS

## 2020-07-07 MED ORDER — ACETAMINOPHEN 10 MG/ML IV SOLN
INTRAVENOUS | Status: DC | PRN
  Administered 2020-07-07: 1000 mg via INTRAVENOUS

## 2020-07-07 MED ORDER — DROPERIDOL 2.5 MG/ML IJ SOLN
0.6250 mg | Freq: Once | INTRAMUSCULAR | Status: DC | PRN
  Filled 2020-07-07: qty 2

## 2020-07-07 MED ORDER — OXYCODONE HCL 5 MG PO TABS
5.0000 mg | ORAL_TABLET | Freq: Once | ORAL | Status: AC | PRN
  Administered 2020-07-07: 5 mg via ORAL

## 2020-07-07 MED ORDER — LORAZEPAM 2 MG/ML IJ SOLN: 1.0000 mg | Freq: Once | INTRAMUSCULAR | Status: DC | PRN

## 2020-07-07 MED ORDER — CEFAZOLIN SODIUM-DEXTROSE 2-4 GM/100ML-% IV SOLN
INTRAVENOUS | Status: AC
  Filled 2020-07-07: qty 100

## 2020-07-07 MED ORDER — HYDROMORPHONE HCL 1 MG/ML IJ SOLN
0.2500 mg | INTRAMUSCULAR | Status: DC | PRN
  Administered 2020-07-07 (×4): 0.5 mg via INTRAVENOUS

## 2020-07-07 SURGICAL SUPPLY — 35 items
ADH LQ OCL WTPRF AMP STRL LF (MISCELLANEOUS) ×1
ADHESIVE MASTISOL STRL (MISCELLANEOUS) ×2 IMPLANT
BAG DRAIN CYSTO-URO LG1000N (MISCELLANEOUS) ×2 IMPLANT
BASKET ZERO TIP 1.9FR (BASKET) ×2 IMPLANT
BRUSH SCRUB EZ 1% IODOPHOR (MISCELLANEOUS) ×2 IMPLANT
BSKT STON RTRVL ZERO TP 1.9FR (BASKET) ×1
CATH URET FLEX-TIP 2 LUMEN 10F (CATHETERS) ×2 IMPLANT
CATH URETL 5X70 OPEN END (CATHETERS) ×2 IMPLANT
CNTNR SPEC 2.5X3XGRAD LEK (MISCELLANEOUS) ×1
CONT SPEC 4OZ STER OR WHT (MISCELLANEOUS) ×1
CONT SPEC 4OZ STRL OR WHT (MISCELLANEOUS) ×1
CONTAINER SPEC 2.5X3XGRAD LEK (MISCELLANEOUS) ×1 IMPLANT
DRAPE UTILITY 15X26 TOWEL STRL (DRAPES) ×2 IMPLANT
DRSG TEGADERM 2-3/8X2-3/4 SM (GAUZE/BANDAGES/DRESSINGS) ×2 IMPLANT
GLOVE BIO SURGEON STRL SZ 6.5 (GLOVE) ×2 IMPLANT
GOWN STRL REUS W/ TWL LRG LVL3 (GOWN DISPOSABLE) ×2 IMPLANT
GOWN STRL REUS W/TWL LRG LVL3 (GOWN DISPOSABLE) ×4
GUIDEWIRE GREEN .038 145CM (MISCELLANEOUS) ×4 IMPLANT
GUIDEWIRE STR DUAL SENSOR (WIRE) ×2 IMPLANT
INFUSOR MANOMETER BAG 3000ML (MISCELLANEOUS) ×2 IMPLANT
INTRODUCER DILATOR DOUBLE (INTRODUCER) ×2 IMPLANT
KIT TURNOVER CYSTO (KITS) ×2 IMPLANT
MANIFOLD NEPTUNE II (INSTRUMENTS) ×2 IMPLANT
PACK CYSTO AR (MISCELLANEOUS) ×2 IMPLANT
SET CYSTO W/LG BORE CLAMP LF (SET/KITS/TRAYS/PACK) ×2 IMPLANT
SHEATH URETERAL 12FR 45CM (SHEATH) ×2 IMPLANT
SHEATH URETERAL 12FRX35CM (MISCELLANEOUS) IMPLANT
SOL .9 NS 3000ML IRR  AL (IV SOLUTION) ×1
SOL .9 NS 3000ML IRR AL (IV SOLUTION) ×1
SOL .9 NS 3000ML IRR UROMATIC (IV SOLUTION) ×1 IMPLANT
STENT URET 6FRX24 CONTOUR (STENTS) IMPLANT
STENT URET 6FRX26 CONTOUR (STENTS) ×2 IMPLANT
SURGILUBE 2OZ TUBE FLIPTOP (MISCELLANEOUS) ×2 IMPLANT
TRACTIP FLEXIVA PULSE ID 200 (Laser) ×2 IMPLANT
WATER STERILE IRR 1000ML POUR (IV SOLUTION) ×2 IMPLANT

## 2020-07-07 NOTE — Anesthesia Preprocedure Evaluation (Signed)
Anesthesia Evaluation  Patient identified by MRN, date of birth, ID band Patient awake    Reviewed: Allergy & Precautions, NPO status , Patient's Chart, lab work & pertinent test results  Airway Mallampati: II       Dental no notable dental hx.    Pulmonary    Pulmonary exam normal breath sounds clear to auscultation       Cardiovascular hypertension, negative cardio ROS Normal cardiovascular exam Rhythm:Regular Rate:Normal     Neuro/Psych PSYCHIATRIC DISORDERS Anxiety Depression negative neurological ROS     GI/Hepatic Neg liver ROS, GERD  ,  Endo/Other  negative endocrine ROS  Renal/GU Renal disease  negative genitourinary   Musculoskeletal negative musculoskeletal ROS (+)   Abdominal   Peds negative pediatric ROS (+)  Hematology negative hematology ROS (+)   Anesthesia Other Findings .Marland KitchenPast Medical History: No date: Anxiety and depression     Comment:  panic attacks No date: Diverticulitis No date: GERD (gastroesophageal reflux disease) No date: Hypertension No date: Nephrolithiasis  Reproductive/Obstetrics negative OB ROS                             Anesthesia Physical Anesthesia Plan  ASA: II  Anesthesia Plan: General   Post-op Pain Management:    Induction:   PONV Risk Score and Plan: 2 and Ondansetron and Dexamethasone  Airway Management Planned: Oral ETT  Additional Equipment:   Intra-op Plan:   Post-operative Plan: Extubation in OR  Informed Consent: I have reviewed the patients History and Physical, chart, labs and discussed the procedure including the risks, benefits and alternatives for the proposed anesthesia with the patient or authorized representative who has indicated his/her understanding and acceptance.       Plan Discussed with: CRNA, Anesthesiologist and Surgeon  Anesthesia Plan Comments:         Anesthesia Quick Evaluation

## 2020-07-07 NOTE — Transfer of Care (Signed)
Immediate Anesthesia Transfer of Care Note  Patient: Jonathan Aguirre  Procedure(s) Performed: CYSTOSCOPY/URETEROSCOPY/HOLMIUM LASER/STENT Exchange (Left )  Patient Location: PACU  Anesthesia Type:General  Level of Consciousness: awake, alert  and oriented  Airway & Oxygen Therapy: Patient Spontanous Breathing and Patient connected to face mask oxygen  Post-op Assessment: Report given to RN and Post -op Vital signs reviewed and stable  Post vital signs: Reviewed and stable  Last Vitals:  Vitals Value Taken Time  BP 128/85 07/07/20 1709  Temp 36.1 C 07/07/20 1709  Pulse 100 07/07/20 1714  Resp 26 07/07/20 1714  SpO2 95 % 07/07/20 1714  Vitals shown include unvalidated device data.  Last Pain:  Vitals:   07/07/20 1709  TempSrc:   PainSc: 0-No pain         Complications: No complications documented.

## 2020-07-07 NOTE — Op Note (Signed)
Date of procedure: 07/07/20  Preoperative diagnosis:  1. Left ureteral calculus 2. Left renal calculi  Postoperative diagnosis:  1. Left kidney stones  Procedure: 1. Left ureteroscopy with laser lithotripsy 2. Left retrograde pyelogram 3. Left ureteral stent change 4. Basket extraction of stone fragment 5. Interpretation of fluoroscopy less than 30 minutes  Surgeon: Hollice Espy, MD  Anesthesia: General  Complications: None  Intraoperative findings: 7 mm stone now located within the renal pelvis.  Additional small calyceal stones also treated.  Uncomplicated.  Stent replaced on tether.  EBL: Minimal  Specimens: Stone fragment  Drains: 6 x 26 French double-J ureteral stent on left with tether  Indication: Jonathan Aguirre is a 49 y.o. patient with an obstructing left proximal ureteral calculus status post stent who returns today for staged procedure.  After reviewing the management options for treatment, he elected to proceed with the above surgical procedure(s). We have discussed the potential benefits and risks of the procedure, side effects of the proposed treatment, the likelihood of the patient achieving the goals of the procedure, and any potential problems that might occur during the procedure or recuperation. Informed consent has been obtained.  Description of procedure:  The patient was taken to the operating room and general anesthesia was induced.  The patient was placed in the dorsal lithotomy position, prepped and draped in the usual sterile fashion, and preoperative antibiotics were administered. A preoperative time-out was performed.   21 French cystoscope advanced per urethra into the bladder.  Attention was turned to the left ureteral orifice from which a ureteral stent was seen emanating.  The distal coil of the stent was grasped and brought to level the urethral meatus.  Is then cannulated using a sensor wire up to level the kidney.  Notably, the stone  previously seen in the proximal ureter on previous CT scan was now seen within the renal pelvis.  There were no shadows of the ureter.  As such, there is a dual-lumen introducer up to the level of the mid ureter and injected Conray.  This revealed a decompressed collecting system without any filling defects within the ureter.  I introduce a second working Soil scientist over which I ended up passing a Lacinda Axon 12/14 French ureteral access sheath to the proximal ureter.  This was placed fluoroscopically.  Once in position, the inner cannula and Super Stiff wire were removed excluding the safety wire which was snapped in place.  A digital flexible 8 French Wolf ureteroscope was then brought in and the stone was identified within the renal pelvis.  A 273 m laser fiber was then brought in using dusting settings of 0.2 J and 40 Hz, the stone was dusted it about 5 pieces.  He did not does particularly well.  As such, used a 1.9 [place] tipless nitinol basket to extract each of these pieces.  A few additional small stones within the calyces were also removed.  The renal unit was completely cleared.  No contrast extravasation was seen.  The scope was backed in length the ureter removing the access sheath along the way.  No ureteral injuries were appreciated.  No additional stone fragments were seen.  The safety wire was then backloaded over rigid cystoscope.  A 6 x 26 French double-J ureteral stent was placed cystoscopically up to level the renal pelvis.  The wire was partially drawn till full coils noted both within the renal pelvis as well as within the bladder.  The bladder was drained.  The  stent string was remained attached to the distal coil which was affixed to the patient's glans using Mastisol and Tegaderm.  He was then cleaned and dried, repositioned in the supine position, reversed from anesthesia, and taken to the PACU in stable condition.  Plan: Remove his own stent on Thursday.  Follow-up in 1 month with renal  ultrasound prior.  Narcotics and anticholinergics were refilled.  Hollice Espy, M.D.

## 2020-07-07 NOTE — Anesthesia Procedure Notes (Signed)
Procedure Name: Intubation Date/Time: 07/07/2020 4:13 PM Performed by: Allean Found, CRNA Pre-anesthesia Checklist: Patient identified, Patient being monitored, Timeout performed, Emergency Drugs available and Suction available Patient Re-evaluated:Patient Re-evaluated prior to induction Oxygen Delivery Method: Circle system utilized Preoxygenation: Pre-oxygenation with 100% oxygen Induction Type: IV induction Ventilation: Mask ventilation without difficulty Laryngoscope Size: McGraph and 4 Grade View: Grade I Tube type: Oral Tube size: 7.5 mm Number of attempts: 1 Airway Equipment and Method: Stylet Placement Confirmation: ETT inserted through vocal cords under direct vision,  positive ETCO2 and breath sounds checked- equal and bilateral Secured at: 21 cm Tube secured with: Tape Dental Injury: Teeth and Oropharynx as per pre-operative assessment

## 2020-07-07 NOTE — Interval H&P Note (Signed)
History and Physical Interval Note:  07/07/2020 3:32 PM  Jonathan Aguirre  has presented today for surgery, with the diagnosis of left ureteral stone, left nephrolithiasis.  The various methods of treatment have been discussed with the patient and family. After consideration of risks, benefits and other options for treatment, the patient has consented to  Procedure(s): CYSTOSCOPY/URETEROSCOPY/HOLMIUM LASER/STENT Exchange (Left) as a surgical intervention.  The patient's history has been reviewed, patient examined, no change in status, stable for surgery.  I have reviewed the patient's chart and labs.  Questions were answered to the patient's satisfaction.    RRR CTAB  Hollice Espy

## 2020-07-07 NOTE — Anesthesia Postprocedure Evaluation (Signed)
Anesthesia Post Note  Patient: [name]  Procedure(s) Performed: CYSTOSCOPY/URETEROSCOPY/HOLMIUM LASER/STENT Exchange (Left )  Patient location during evaluation: PACU Anesthesia Type: General Level of consciousness: awake and alert Pain management: pain level controlled Vital Signs Assessment: post-procedure vital signs reviewed and stable Respiratory status: spontaneous breathing, nonlabored ventilation, respiratory function stable and patient connected to nasal cannula oxygen Cardiovascular status: blood pressure returned to baseline and stable Postop Assessment: no apparent nausea or vomiting Anesthetic complications: no   No complications documented.   Last Vitals:  Vitals:   07/07/20 1916 07/07/20 1918  BP:  116/89  Pulse: (!) 105 94  Resp:  20  Temp: 36.7 C 36.7 C  SpO2: 93% 94%    Last Pain:  Vitals:   07/07/20 1918  TempSrc: Temporal  PainSc: [name]

## 2020-07-07 NOTE — Progress Notes (Signed)
Dr Erlene Quan made aware of pts pain level at a 8 with lots of meds on board, Orders received and Torodol and B and O suppository given

## 2020-07-07 NOTE — Discharge Instructions (Addendum)
You have a ureteral stent in place.  This is a tube that extends from your kidney to your bladder.  This may cause urinary bleeding, burning with urination, and urinary frequency.  Please call our office or present to the ED if you develop fevers >101 or pain which is not able to be controlled with oral pain medications.  You may be given either Flomax and/ or ditropan to help with bladder spasms and stent pain in addition to pain medications.    You have a stent.  Its attached to string taped to the head of your penis.  On Thursday morning, you may untape and remove the stent in entirety.  If you have any issues concerns or questions call feel free to call our office.  We will be happy to assist you.  [facility] [address] [place], [place] [address] [phone]   AMBULATORY SURGERY  DISCHARGE INSTRUCTIONS   1) The drugs that you were given will stay in your system until tomorrow so for the next 24 hours you should not:  A) Drive an automobile B) Make any legal decisions C) Drink any alcoholic beverage   2) You may resume regular meals tomorrow.  Today it is better to start with liquids and gradually work up to solid foods.  You may eat anything you prefer, but it is better to start with liquids, then soup and crackers, and gradually work up to solid foods.   3) Please notify your doctor immediately if you have any unusual bleeding, trouble breathing, redness and pain at the surgery site, drainage, fever, or pain not relieved by medication.    4) Additional Instructions:        Please contact your physician with any problems or Same Day Surgery at [phone], Monday through Friday 6 am to 4 pm, or [place] at [facility] number at [phone].

## 2020-07-08 ENCOUNTER — Encounter: Payer: Self-pay | Admitting: Urology

## 2020-07-08 ENCOUNTER — Other Ambulatory Visit: Payer: Self-pay | Admitting: Radiology

## 2020-07-08 DIAGNOSIS — N200 Calculus of kidney: Secondary | ICD-10-CM

## 2020-07-08 DIAGNOSIS — N201 Calculus of ureter: Secondary | ICD-10-CM

## 2020-07-12 LAB — CALCULI, WITH PHOTOGRAPH (CLINICAL LAB)
Calcium Oxalate Dihydrate: 20 %
Calcium Oxalate Monohydrate: 80 %
Weight Calculi: 39 mg

## 2020-08-06 ENCOUNTER — Ambulatory Visit
Admission: RE | Admit: 2020-08-06 | Discharge: 2020-08-06 | Disposition: A | Discharge status: Home / Self Care | Payer: PPO | Source: Ambulatory Visit | Attending: Urology | Admitting: Urology

## 2020-08-06 ENCOUNTER — Other Ambulatory Visit: Payer: Self-pay

## 2020-08-06 DIAGNOSIS — N201 Calculus of ureter: Secondary | ICD-10-CM

## 2020-08-06 DIAGNOSIS — N200 Calculus of kidney: Secondary | ICD-10-CM | POA: Diagnosis not present

## 2020-08-07 ENCOUNTER — Ambulatory Visit (INDEPENDENT_AMBULATORY_CARE_PROVIDER_SITE_OTHER): Payer: PPO | Admitting: Physician Assistant

## 2020-08-07 ENCOUNTER — Encounter: Payer: Self-pay | Admitting: Physician Assistant

## 2020-08-07 VITALS — BP 149/107 | HR 99 | Ht 73.0 in | Wt 300.0 lb

## 2020-08-07 DIAGNOSIS — N201 Calculus of ureter: Secondary | ICD-10-CM | POA: Diagnosis not present

## 2020-08-07 LAB — URINALYSIS, COMPLETE
Bilirubin, UA: NEGATIVE
Glucose, UA: NEGATIVE
Ketones, UA: NEGATIVE
Leukocytes,UA: NEGATIVE
Nitrite, UA: NEGATIVE
Protein,UA: NEGATIVE
RBC, UA: NEGATIVE
Specific Gravity, UA: 1.025 (ref 1.005–1.030)
Urobilinogen, Ur: 0.2 mg/dL (ref 0.2–1.0)
pH, UA: 6 (ref 5.0–7.5)

## 2020-08-07 LAB — MICROSCOPIC EXAMINATION
Bacteria, UA: NONE SEEN
Epithelial Cells (non renal): NONE SEEN /hpf (ref 0–10)

## 2020-08-07 NOTE — Progress Notes (Signed)
08/07/2020 10:01 AM   Jason Nest 05-17-1971 [id number]  CC: Chief Complaint  Patient presents with  . Results   HPI: Jonathan Aguirre is a 49 y.o. male who underwent left URS/LL/stent exchange with Dr. Apolinar Junes on 07/07/2020 for management of left renal stones who presents today for postop follow-up.  Today, patient reports resolution of flank pain following surgery.  He removed his stent at home on POD 3 and reports some pain with this, but otherwise has done well.  Per CT stone study dated 06/28/2020, he is not believed to have any residual nephrolithiasis.  He underwent renal ultrasound yesterday with no residual hydronephrosis.  In-office UA and microscopy today pan negative.  PMH: Past Medical History:  Diagnosis Date  . Anxiety and depression    panic attacks  . Diverticulitis   . GERD (gastroesophageal reflux disease)   . Hypertension   . Nephrolithiasis     Surgical History: Past Surgical History:  Procedure Laterality Date  . COLON SURGERY    . CYSTOSCOPY WITH STENT PLACEMENT Left 06/28/2020   Procedure: CYSTOSCOPY WITH STENT PLACEMENT;  Surgeon: Bjorn Pippin, MD;  Location: [facility] ORS;  Service: Urology;  Laterality: Left;  . CYSTOSCOPY/RETROGRADE/URETEROSCOPY/STONE EXTRACTION WITH BASKET  2016  . CYSTOSCOPY/URETEROSCOPY/HOLMIUM LASER Left 06/28/2020   Procedure: CYSTOSCOPY/URETEROSCOPY;  Surgeon: Bjorn Pippin, MD;  Location: [facility] OR[facility];  Service: Urology;  Laterality: Left;  . CYSTOSCOPY/URETEROSCOPY/HOLMIUM LASER/STENT PLACEMENT Left 07/07/2020   Procedure: CYSTOSCOPY/URETEROSCOPY/HOLMIUM LASER/STENT Exchange;  Surgeon: Vanna Scotland, MD;  Location: [facility] OR[facility];  Service: Urology;  Laterality: Left;    Home Medications:  Allergies as of 08/07/2020   No Known Allergies     Medication List       Accurate as of August 07, 2020 10:01 AM. If you have any questions, ask your nurse or doctor.        cyclobenzaprine 10 MG tablet Commonly known as:  FLEXERIL Take 10 mg by mouth daily as needed for muscle spasms.   DULoxetine 30 MG capsule Commonly known as: CYMBALTA Take 30 mg by mouth daily.   hydrOXYzine 50 MG tablet Commonly known as: ATARAX/VISTARIL Take 50 mg by mouth every 8 (eight) hours as needed for anxiety.   lamoTRIgine 100 MG tablet Commonly known as: LAMICTAL Take 100 mg by mouth daily.   lamoTRIgine 150 MG tablet Commonly known as: LAMICTAL Take 150 mg by mouth daily.   metoprolol succinate 25 MG 24 hr tablet Commonly known as: TOPROL-XL Take 25 mg by mouth daily.   multivitamin tablet Take 1 tablet by mouth daily.   omeprazole 20 MG capsule Commonly known as: PRILOSEC Take 20 mg by mouth 2 (two) times daily before a meal.   ondansetron 4 MG disintegrating tablet Commonly known as: Zofran ODT Take 1 tablet (4 mg total) by mouth every 8 (eight) hours as needed for nausea or vomiting.   oxybutynin 5 MG tablet Commonly known as: DITROPAN Take 1 tablet (5 mg total) by mouth every 8 (eight) hours as needed for bladder spasms.   oxyCODONE-acetaminophen 5-325 MG tablet Commonly known as: Percocet Take 1-2 tablets by mouth every 6 (six) hours as needed for severe pain.   phenazopyridine 200 MG tablet Commonly known as: PYRIDIUM Take 1 tablet (200 mg total) by mouth 3 (three) times daily as needed (bladder pain).   rosuvastatin 5 MG tablet Commonly known as: CRESTOR Take 1 tablet (5 mg total) by mouth daily.   tamsulosin 0.4 MG Caps capsule Commonly known as: FLOMAX Take 1 capsule (  0.4 mg total) by mouth daily.   tiZANidine 4 MG tablet Commonly known as: ZANAFLEX Take 4 mg by mouth at bedtime.   traZODone 100 MG tablet Commonly known as: DESYREL Take 200 mg by mouth at bedtime.       Allergies:  No Known Allergies  Family History: Family History  Problem Relation Age of Onset  . Heart failure Mother   . Hyperlipidemia Mother   . Hypertension Mother   . Prostate cancer Neg Hx   .  Bladder Cancer Neg Hx   . Kidney cancer Neg Hx     Social History:   reports that he has never smoked. He has quit using smokeless tobacco. He reports that he does not drink alcohol and does not use drugs.  Physical Exam: BP (!) 149/107 (BP Location: Left Arm, Patient Position: Sitting, Cuff Size: Large)   Pulse 99   Ht 6\' 1"  (1.854 m)   Wt 300 lb (136.1 kg)   BMI 39.58 kg/m   Constitutional:  Alert and oriented, no acute distress, nontoxic appearing HEENT: [place], AT Cardiovascular: No clubbing, cyanosis, or edema Respiratory: Normal respiratory effort, no increased work of breathing Skin: No rashes, bruises or suspicious lesions Neurologic: Grossly intact, no focal deficits, moving all 4 extremities Psychiatric: Normal mood and affect  Laboratory Data: Results for orders placed or performed in visit on 08/07/20  Microscopic Examination   Urine  Result Value Ref Range   WBC, UA 0-5 0 - 5 /hpf   RBC 0-2 0 - 2 /hpf   Epithelial Cells (non renal) None seen 0 - 10 /hpf   Bacteria, UA None seen None seen/Few  Urinalysis, Complete  Result Value Ref Range   Specific Gravity, UA 1.025 1.005 - 1.030   pH, UA 6.0 5.0 - 7.5   Color, UA Orange Yellow   Appearance Ur Hazy (A) Clear   Leukocytes,UA Negative Negative   Protein,UA Negative Negative/Trace   Glucose, UA Negative Negative   Ketones, UA Negative Negative   RBC, UA Negative Negative   Bilirubin, UA Negative Negative   Urobilinogen, Ur 0.2 0.2 - 1.0 mg/dL   Nitrite, UA Negative Negative   Microscopic Examination See below:    Pertinent Imaging: US renal, 08/06/2020: CLINICAL DATA:  Left renal stone follow-up.  EXAM: RENAL / URINARY TRACT ULTRASOUND COMPLETE  COMPARISON:  CT abdomen pelvis dated 06/28/2020.  FINDINGS: Right Kidney:  Renal measurements: 11.9 x 6.4 x 7.1 cm = volume: 280 mL. Echogenicity within normal limits. No mass or hydronephrosis visualized.  Left Kidney:  Renal measurements: 11.9 x  6.5 x 5.9 cm = volume: 237 mL. Echogenicity within normal limits. No mass or hydronephrosis visualized.  Bladder:  Appears normal for degree of bladder distention.  Other:  None.  IMPRESSION: No evidence of hydronephrosis. No renal calculi identified.   Electronically Signed   By: Zerita Boers M.D.   On: 08/06/2020 11:59  I personally reviewed the imaging above and note no hydronephrosis.  Assessment & Plan:   1. Left ureteral stone No residual pain, hydronephrosis, or microscopic hematuria.  No further intervention indicated.  Patient prefers to follow-up as needed for further stone episodes.  I am in agreement with this plan. - Urinalysis, Complete  Return if symptoms worsen or fail to improve.  Debroah Loop, PA-C  [facility] [address] [place], [place] [address] [phone]

## 2020-08-13 IMAGING — CT CT ABDOMEN AND PELVIS WITH CONTRAST
2 of 5 series · 16 of 46 positions shown, 18 images · IV contrast (APPLIED)
Comparison: 07/10/2017

CLINICAL DATA: Right lower quadrant abdominal pain, diarrhea, band
ascites suspected

EXAM:
CT ABDOMEN AND PELVIS WITH CONTRAST
TECHNIQUE: Multidetector CT imaging of the abdomen and pelvis was performed
using the standard protocol following bolus administration of
intravenous contrast.
CONTRAST:  100mL OMNIPAQUE IOHEXOL 300 MG/ML  SOLN

[Series 2: routine abd/pel with · axial · 0.92mm/px · z∈[-668,-188]mm · 13 of 108 slices shown, 15 images]
[im 6/108  soft-tissue]
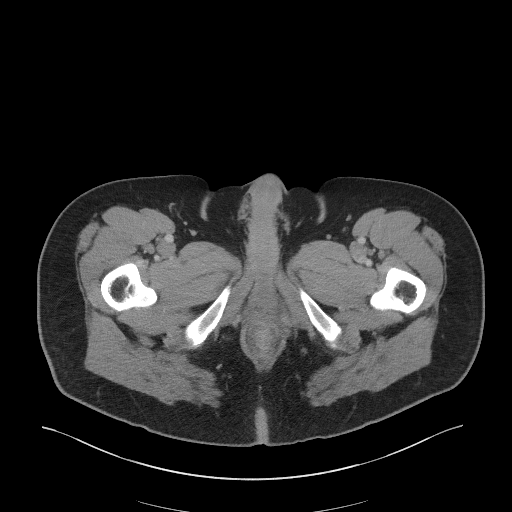
[im 6/108  bone]
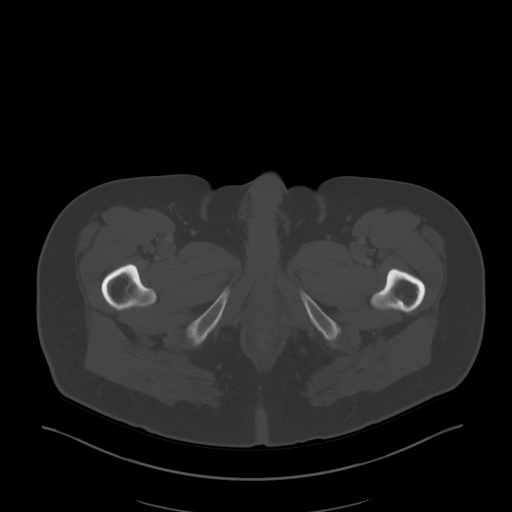
[im 17/108  soft-tissue]
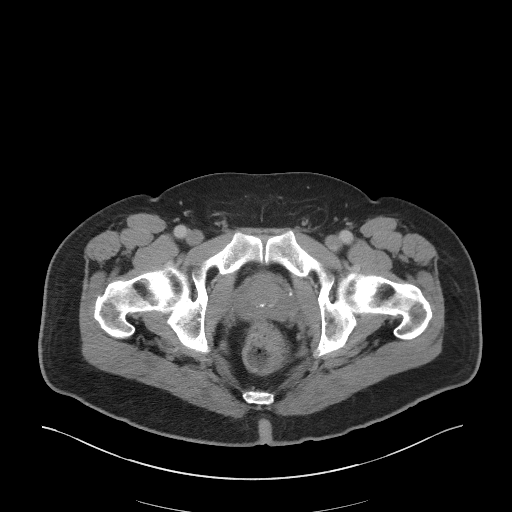
[im 23/108  soft-tissue]
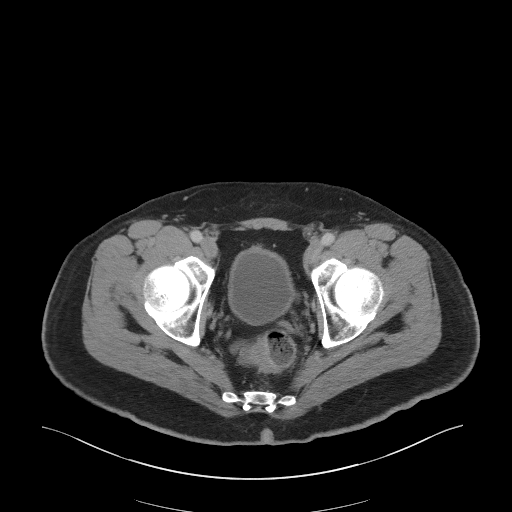
[im 29/108  soft-tissue]
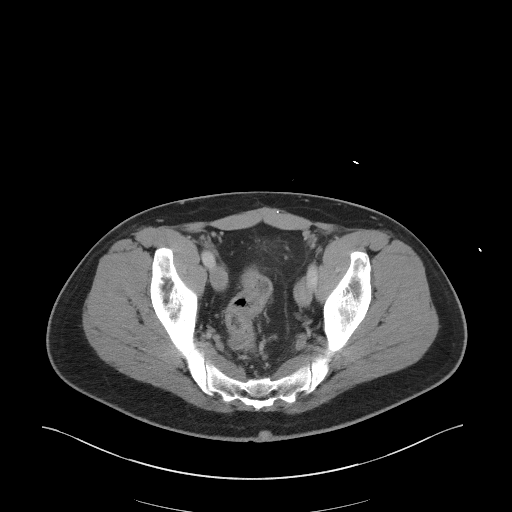
[im 40/108  soft-tissue]
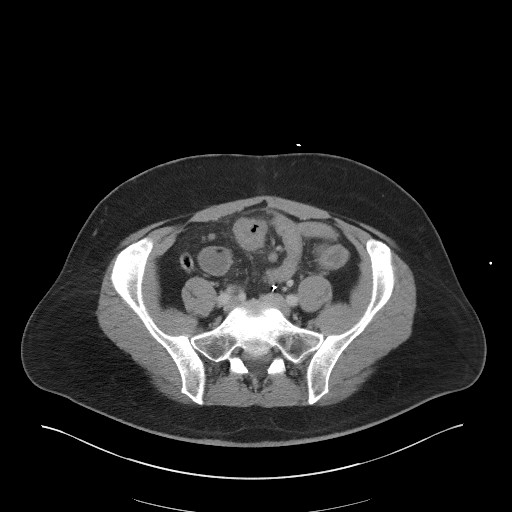
[im 46/108  soft-tissue]
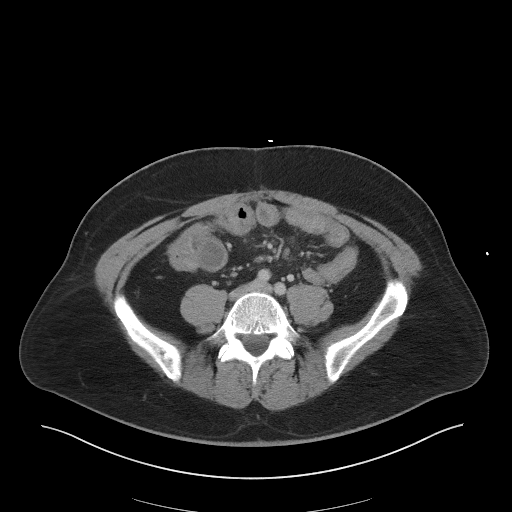
[im 57/108  soft-tissue]
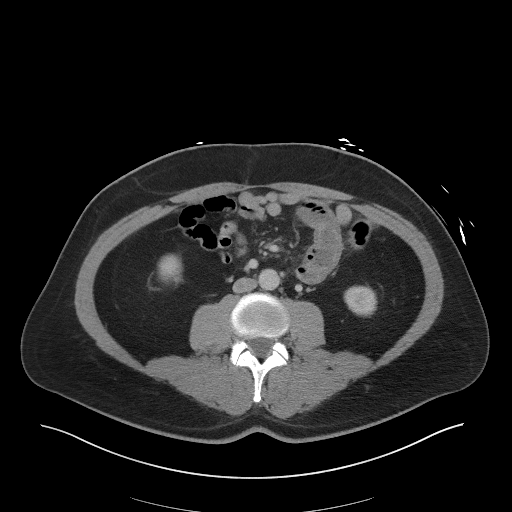
[im 62/108  soft-tissue]
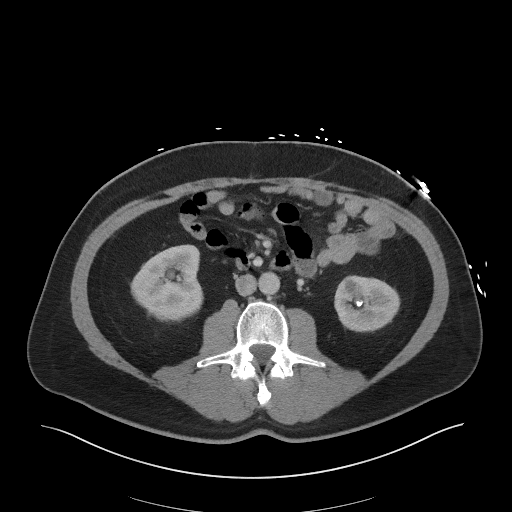
[im 68/108  soft-tissue]
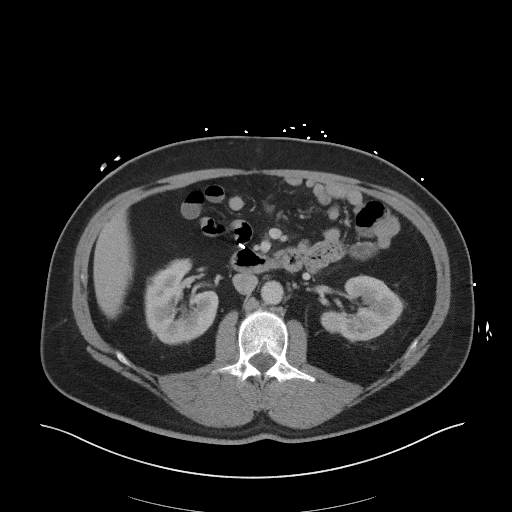
[im 68/108  bone]
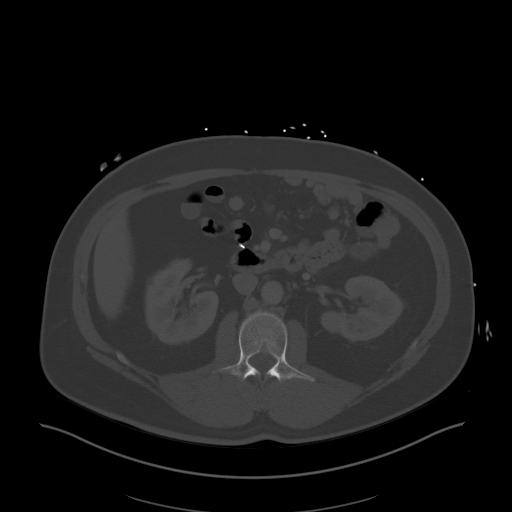
[im 79/108  soft-tissue]
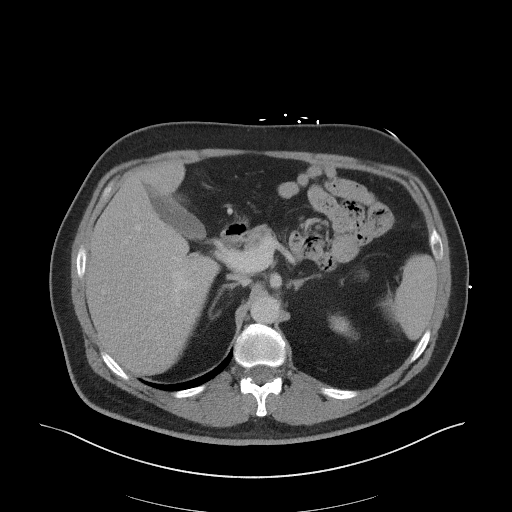
[im 85/108  soft-tissue]
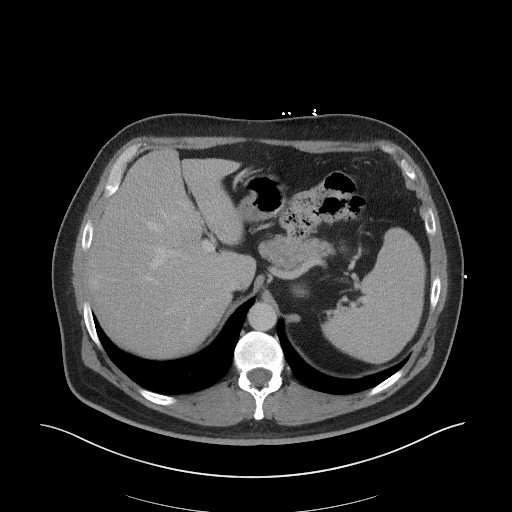
[im 91/108  soft-tissue]
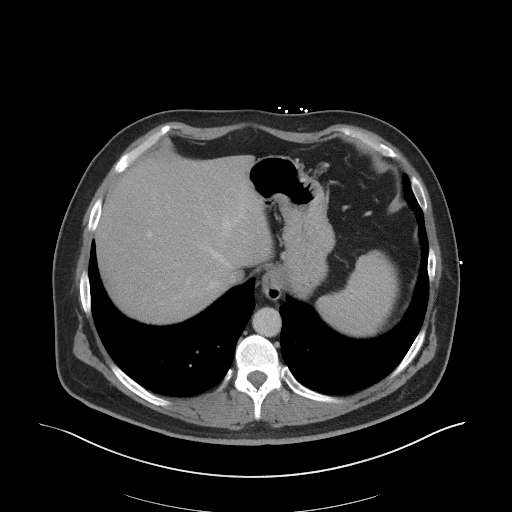
[im 102/108  soft-tissue]
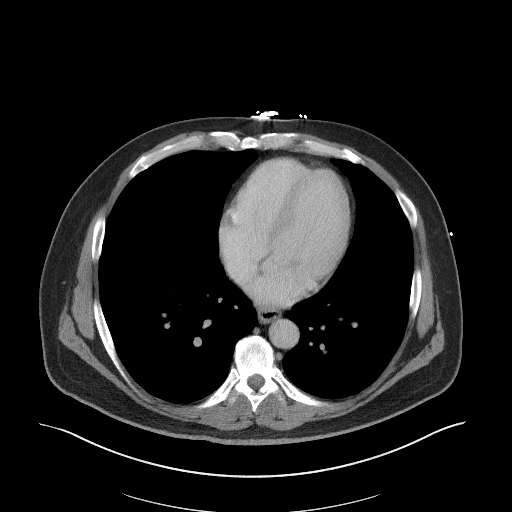

[Series 5: coronal st · coronal · 0.86mm/px · 3 of 98 slices shown]
[im 33/98  soft-tissue]
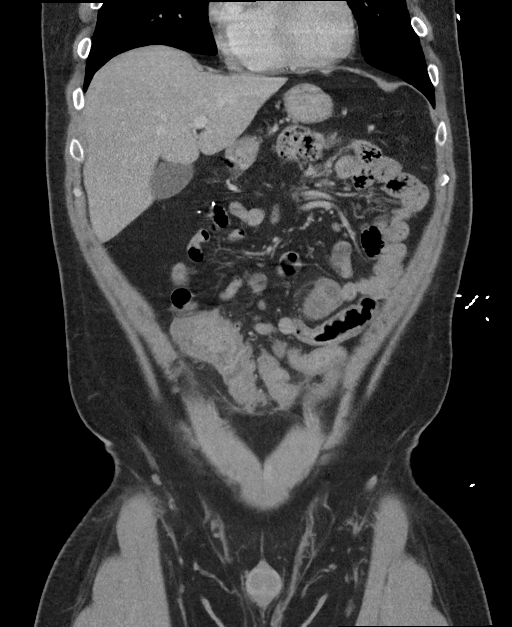
[im 44/98  soft-tissue]
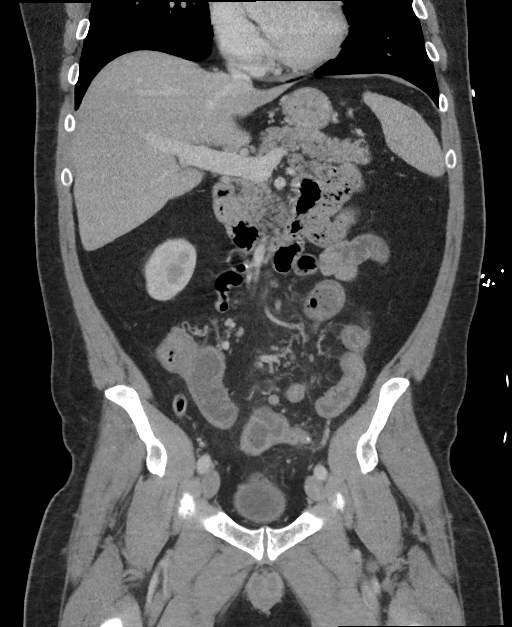
[im 54/98  soft-tissue]
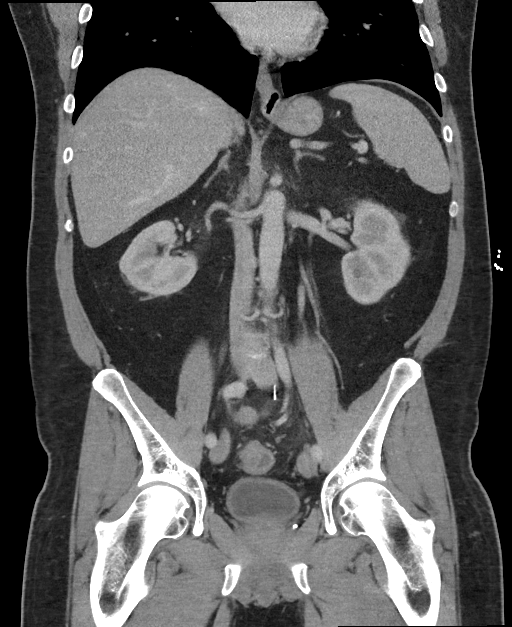

[16 of 46 positions shown; findings below may reference images not displayed]

FINDINGS: Lower chest: No acute abnormality.

Hepatobiliary: No solid liver abnormality is seen. No gallstones,
gallbladder wall thickening, or biliary dilatation.

Pancreas: Unremarkable. No pancreatic ductal dilatation or
surrounding inflammatory changes.

Spleen: Normal in size without significant abnormality.

Adrenals/Urinary Tract: Adrenal glands are unremarkable.
Nonobstructive inferior pole left nephrolithiasis. No
hydronephrosis. Bladder is unremarkable.

Stomach/Bowel: Stomach is within normal limits. Appendix appears
normal. There is evidence of prior partial colon resection. There
are numerous loops of thickened, inflamed appearing small-bowel as
well as the descending and sigmoid colon about the anastomosis
(series 2, image 68, 76, series 5, image 43).

Vascular/Lymphatic: No significant vascular findings are present. No
enlarged abdominal or pelvic lymph nodes.

Reproductive: No mass or other significant abnormality.

Other: No abdominal wall hernia or abnormality. No abdominopelvic
ascites.

Musculoskeletal: No acute or significant osseous findings.
IMPRESSION: There is evidence of prior partial colon resection. There are
numerous loops of thickened, inflamed appearing small-bowel as well
as the descending and sigmoid colon about the anastomosis (series 2,
image 68, 76). Findings are consistent with nonspecific infectious,
inflammatory, or ischemic enterocolitis. The appendix is normal.

## 2020-09-20 DIAGNOSIS — S93402A Sprain of unspecified ligament of left ankle, initial encounter: Secondary | ICD-10-CM | POA: Diagnosis not present

## 2020-09-20 DIAGNOSIS — M25562 Pain in left knee: Secondary | ICD-10-CM | POA: Diagnosis not present

## 2021-04-14 DIAGNOSIS — Z114 Encounter for screening for human immunodeficiency virus [HIV]: Secondary | ICD-10-CM | POA: Diagnosis not present

## 2021-04-14 DIAGNOSIS — I878 Other specified disorders of veins: Secondary | ICD-10-CM | POA: Diagnosis not present

## 2021-04-14 DIAGNOSIS — E782 Mixed hyperlipidemia: Secondary | ICD-10-CM | POA: Diagnosis not present

## 2021-04-14 DIAGNOSIS — R109 Unspecified abdominal pain: Secondary | ICD-10-CM | POA: Diagnosis not present

## 2021-04-14 DIAGNOSIS — Z125 Encounter for screening for malignant neoplasm of prostate: Secondary | ICD-10-CM | POA: Diagnosis not present

## 2021-04-14 DIAGNOSIS — Z79899 Other long term (current) drug therapy: Secondary | ICD-10-CM | POA: Diagnosis not present

## 2021-04-14 DIAGNOSIS — G43709 Chronic migraine without aura, not intractable, without status migrainosus: Secondary | ICD-10-CM | POA: Diagnosis not present

## 2021-04-14 DIAGNOSIS — Z1211 Encounter for screening for malignant neoplasm of colon: Secondary | ICD-10-CM | POA: Diagnosis not present

## 2021-04-14 DIAGNOSIS — R1084 Generalized abdominal pain: Secondary | ICD-10-CM | POA: Diagnosis not present

## 2021-04-14 DIAGNOSIS — F411 Generalized anxiety disorder: Secondary | ICD-10-CM | POA: Diagnosis not present

## 2021-04-14 DIAGNOSIS — Z1159 Encounter for screening for other viral diseases: Secondary | ICD-10-CM | POA: Diagnosis not present

## 2021-05-12 ENCOUNTER — Emergency Department
Admission: EM | Admit: 2021-05-12 | Discharge: 2021-05-12 | Disposition: A | Discharge status: Home / Self Care | Payer: PPO | Attending: Emergency Medicine | Admitting: Emergency Medicine

## 2021-05-12 ENCOUNTER — Other Ambulatory Visit: Payer: Self-pay

## 2021-05-12 ENCOUNTER — Emergency Department: Payer: PPO

## 2021-05-12 DIAGNOSIS — R1110 Vomiting, unspecified: Secondary | ICD-10-CM | POA: Diagnosis not present

## 2021-05-12 DIAGNOSIS — K529 Noninfective gastroenteritis and colitis, unspecified: Secondary | ICD-10-CM | POA: Insufficient documentation

## 2021-05-12 DIAGNOSIS — R1013 Epigastric pain: Secondary | ICD-10-CM

## 2021-05-12 DIAGNOSIS — I10 Essential (primary) hypertension: Secondary | ICD-10-CM | POA: Diagnosis not present

## 2021-05-12 DIAGNOSIS — R112 Nausea with vomiting, unspecified: Secondary | ICD-10-CM | POA: Diagnosis not present

## 2021-05-12 DIAGNOSIS — Z79899 Other long term (current) drug therapy: Secondary | ICD-10-CM | POA: Diagnosis not present

## 2021-05-12 DIAGNOSIS — K219 Gastro-esophageal reflux disease without esophagitis: Secondary | ICD-10-CM | POA: Diagnosis not present

## 2021-05-12 DIAGNOSIS — Z87891 Personal history of nicotine dependence: Secondary | ICD-10-CM | POA: Insufficient documentation

## 2021-05-12 DIAGNOSIS — R109 Unspecified abdominal pain: Secondary | ICD-10-CM | POA: Diagnosis not present

## 2021-05-12 LAB — URINALYSIS, COMPLETE (UACMP) WITH MICROSCOPIC
Bacteria, UA: NONE SEEN
Bilirubin Urine: NEGATIVE
Glucose, UA: NEGATIVE mg/dL
Hgb urine dipstick: NEGATIVE
Ketones, ur: NEGATIVE mg/dL
Leukocytes,Ua: NEGATIVE
Nitrite: NEGATIVE
Protein, ur: NEGATIVE mg/dL
Specific Gravity, Urine: >1.046 — ABNORMAL HIGH (ref 1.005–1.030)
Squamous Epithelial / HPF: NONE SEEN (ref 0–5)
pH: 5 (ref 5.0–8.0)

## 2021-05-12 LAB — COMPREHENSIVE METABOLIC PANEL
ALT: 26 U/L (ref 0–44)
AST: 19 U/L (ref 15–41)
Albumin: 4.4 g/dL (ref 3.5–5.0)
Alkaline Phosphatase: 53 U/L (ref 38–126)
Anion gap: 7 (ref 5–15)
BUN: 12 mg/dL (ref 6–20)
CO2: 26 mmol/L (ref 22–32)
Calcium: 9.4 mg/dL (ref 8.9–10.3)
Chloride: 105 mmol/L (ref 98–111)
Creatinine, Ser: 0.97 mg/dL (ref 0.61–1.24)
GFR, Estimated: >60 mL/min (ref >60)
Glucose, Bld: 120 mg/dL — ABNORMAL HIGH (ref 70–99)
Potassium: 3.6 mmol/L (ref 3.5–5.1)
Sodium: 138 mmol/L (ref 135–145)
Total Bilirubin: 1.5 mg/dL — ABNORMAL HIGH (ref 0.3–1.2)
Total Protein: 7.4 g/dL (ref 6.5–8.1)

## 2021-05-12 LAB — CBC
HCT: 49.5 % (ref 39.0–52.0)
Hemoglobin: 18.1 g/dL — ABNORMAL HIGH (ref 13.0–17.0)
MCH: 31.3 pg (ref 26.0–34.0)
MCHC: 36.6 g/dL — ABNORMAL HIGH (ref 30.0–36.0)
MCV: 85.5 fL (ref 80.0–100.0)
Platelets: 335 10*3/uL (ref 150–400)
RBC: 5.79 MIL/uL (ref 4.22–5.81)
RDW: 12.4 % (ref 11.5–15.5)
WBC: 14.5 10*3/uL — ABNORMAL HIGH (ref 4.0–10.5)
nRBC: 0 % (ref 0.0–0.2)

## 2021-05-12 LAB — LIPASE, BLOOD: Lipase: 37 U/L (ref 11–51)

## 2021-05-12 MED ORDER — LACTATED RINGERS IV BOLUS
1000.0000 mL | Freq: Once | INTRAVENOUS | Status: AC
  Administered 2021-05-12: 1000 mL via INTRAVENOUS

## 2021-05-12 MED ORDER — PROMETHAZINE HCL 25 MG RE SUPP: 25.0000 mg | Freq: Four times a day (QID) | RECTAL | 0 refills | Status: DC | PRN

## 2021-05-12 MED ORDER — SODIUM CHLORIDE 0.9 % IV SOLN
12.5000 mg | Freq: Once | INTRAVENOUS | Status: AC
  Administered 2021-05-12: 12.5 mg via INTRAVENOUS
  Filled 2021-05-12: qty 12.5

## 2021-05-12 MED ORDER — MORPHINE SULFATE (PF) 4 MG/ML IV SOLN
4.0000 mg | Freq: Once | INTRAVENOUS | Status: AC
  Administered 2021-05-12: 4 mg via INTRAVENOUS
  Filled 2021-05-12: qty 1

## 2021-05-12 MED ORDER — IOHEXOL 350 MG/ML SOLN
100.0000 mL | Freq: Once | INTRAVENOUS | Status: AC | PRN
  Administered 2021-05-12: 100 mL via INTRAVENOUS
  Filled 2021-05-12: qty 100

## 2021-05-12 NOTE — ED Provider Notes (Signed)
[facility] Emergency Department Provider Note   ____________________________________________   Event Date/Time   First MD Initiated Contact with Patient 05/12/21 [phone]     (approximate)  I have reviewed the triage vital signs and the nursing notes.   HISTORY  Chief Complaint Abdominal Pain    HPI Jonathan Aguirre is a 50 y.o. male with past medical history of hypertension, GERD, diverticulitis status post large bowel resection, and kidney stones who presents to the ED complaining of abdominal pain.  Patient reports that he has had 5 days of constant pain in his upper abdomen associated with nausea, vomiting, and diarrhea.  Pain is primarily located in his epigastrium but also moves to his left upper and right upper quadrants.  He describes it as a sharp and stabbing discomfort that is not exacerbated or alleviated by anything.  He has not noticed any blood in his emesis or stool.  He denies any fevers, dysuria, flank pain, chest pain, or shortness of breath.  He reports similar pain in his abdomen when he was diagnosed with a bowel obstruction in 2019.        Past Medical History:  Diagnosis Date   Anxiety and depression    panic attacks   Diverticulitis    GERD (gastroesophageal reflux disease)    Hypertension    Nephrolithiasis     Patient Active Problem List   Diagnosis Date Noted   Left ureteral stone 06/28/2020   Shortness of breath 12/14/2016   Hypertension 12/14/2016   Chest pain 12/14/2016   Family history of premature CAD 12/14/2016   Morbid obesity ([place]) 12/14/2016   Anxiety 12/14/2016   Dizziness 12/14/2016    Past Surgical History:  Procedure Laterality Date   COLON SURGERY     CYSTOSCOPY WITH STENT PLACEMENT Left 06/28/2020   Procedure: CYSTOSCOPY WITH STENT PLACEMENT;  Surgeon: Irine Seal, MD;  Location: [facility] ORS;  Service: Urology;  Laterality: Left;   CYSTOSCOPY/RETROGRADE/URETEROSCOPY/STONE EXTRACTION WITH BASKET  2016    CYSTOSCOPY/URETEROSCOPY/HOLMIUM LASER Left 06/28/2020   Procedure: CYSTOSCOPY/URETEROSCOPY;  Surgeon: Irine Seal, MD;  Location: [facility] OR[facility];  Service: Urology;  Laterality: Left;   CYSTOSCOPY/URETEROSCOPY/HOLMIUM LASER/STENT PLACEMENT Left 07/07/2020   Procedure: CYSTOSCOPY/URETEROSCOPY/HOLMIUM LASER/STENT Exchange;  Surgeon: Hollice Espy, MD;  Location: [facility] ORS;  Service: Urology;  Laterality: Left;    Prior to Admission medications   Medication Sig Start Date End Date Taking? Authorizing Provider  promethazine (PHENERGAN) 25 MG suppository Place 1 suppository (25 mg total) rectally every 6 (six) hours as needed for nausea or vomiting. 05/12/21  Yes Blake Divine, MD  cyclobenzaprine (FLEXERIL) 10 MG tablet Take 10 mg by mouth daily as needed for muscle spasms.    [provider]  DULoxetine (CYMBALTA) 30 MG capsule Take 30 mg by mouth daily. Patient not taking: Reported on 08/07/2020 04/15/20   [provider]  hydrOXYzine (ATARAX/VISTARIL) 50 MG tablet Take 50 mg by mouth every 8 (eight) hours as needed for anxiety. 06/26/20   [provider]  lamoTRIgine (LAMICTAL) 100 MG tablet Take 100 mg by mouth daily.    [provider]  lamoTRIgine (LAMICTAL) 150 MG tablet Take 150 mg by mouth daily. 06/18/20   [provider]  metoprolol succinate (TOPROL-XL) 25 MG 24 hr tablet Take 25 mg by mouth daily.  11/11/17   [provider]  Multiple Vitamin (MULTIVITAMIN) tablet Take 1 tablet by mouth daily.    [provider]  omeprazole (PRILOSEC) 20 MG capsule Take 20 mg by  mouth 2 (two) times daily before a meal.    [provider]  ondansetron (ZOFRAN ODT) 4 MG disintegrating tablet Take 1 tablet (4 mg total) by mouth every 8 (eight) hours as needed for nausea or vomiting. 02/04/19   Duffy Bruce, MD  oxybutynin (DITROPAN) 5 MG tablet Take 1 tablet (5 mg total) by mouth every 8 (eight) hours as needed for bladder spasms. Patient  not taking: Reported on 08/07/2020 07/07/20   Hollice Espy, MD  oxyCODONE-acetaminophen (PERCOCET) 5-325 MG tablet Take 1-2 tablets by mouth every 6 (six) hours as needed for severe pain. Patient not taking: Reported on 08/07/2020 07/07/20 07/07/21  Hollice Espy, MD  phenazopyridine (PYRIDIUM) 200 MG tablet Take 1 tablet (200 mg total) by mouth 3 (three) times daily as needed (bladder pain). Patient not taking: Reported on 08/07/2020 06/30/20   Debroah Loop, PA-C  rosuvastatin (CRESTOR) 5 MG tablet Take 1 tablet (5 mg total) by mouth daily. 04/18/18   Minna Merritts, MD  tamsulosin (FLOMAX) 0.4 MG CAPS capsule Take 1 capsule (0.4 mg total) by mouth daily. Patient not taking: Reported on 08/07/2020 07/01/20   Debroah Loop, PA-C  tiZANidine (ZANAFLEX) 4 MG tablet Take 4 mg by mouth at bedtime. Patient not taking: Reported on 08/07/2020 04/15/20   [provider]  traZODone (DESYREL) 100 MG tablet Take 200 mg by mouth at bedtime.    [provider]    Allergies Patient has no known allergies.  Family History  Problem Relation Age of Onset   Heart failure Mother    Hyperlipidemia Mother    Hypertension Mother    Prostate cancer Neg Hx    Bladder Cancer Neg Hx    Kidney cancer Neg Hx     Social History Social History   Tobacco Use   Smoking status: Never   Smokeless tobacco: Former  Scientific laboratory technician Use: Never used  Substance Use Topics   Alcohol use: No   Drug use: No    Review of Systems  Constitutional: No fever/chills Eyes: No visual changes. ENT: No sore throat. Cardiovascular: Denies chest pain. Respiratory: Denies shortness of breath. Gastrointestinal: Positive for abdominal pain, nausea, vomiting, and diarrhea.  No constipation. Genitourinary: Negative for dysuria. Musculoskeletal: Negative for back pain. Skin: Negative for rash. Neurological: Negative for headaches, focal weakness or  numbness.  ____________________________________________   PHYSICAL EXAM:  VITAL SIGNS: ED Triage Vitals  Enc Vitals Group     BP 05/12/21 0843 (!) 125/98     Pulse Rate 05/12/21 0843 (!) 129     Resp 05/12/21 0843 18     Temp 05/12/21 0843 97.7 F (36.5 C)     Temp Source 05/12/21 0843 Oral     SpO2 05/12/21 0843 96 %     Weight 05/12/21 0844 280 lb (127 kg)     Height 05/12/21 0844 6\' 1"  (1.854 m)     Head Circumference --      Peak Flow --      Pain Score 05/12/21 0844 4     Pain Loc --      Pain Edu? --      Excl. in [place]? --     Constitutional: Alert and oriented. Eyes: Conjunctivae are normal. Head: Atraumatic. Nose: No congestion/rhinnorhea. Mouth/Throat: Mucous membranes are moist. Neck: Normal ROM Cardiovascular: Normal rate, regular rhythm. Grossly normal heart sounds.  2+ radial pulses bilaterally. Respiratory: Normal respiratory effort.  No retractions. Lungs CTAB. Gastrointestinal: Soft and tender to palpation in  his epigastrium and bilateral upper quadrants with no rebound or guarding. No distention. Genitourinary: deferred Musculoskeletal: No lower extremity tenderness nor edema. Neurologic:  Normal speech and language. No gross focal neurologic deficits are appreciated. Skin:  Skin is warm, dry and intact. No rash noted. Psychiatric: Mood and affect are normal. Speech and behavior are normal.  ____________________________________________   LABS (all labs ordered are listed, but only abnormal results are displayed)  Labs Reviewed  COMPREHENSIVE METABOLIC PANEL - Abnormal; Notable for the following components:      Result Value   Glucose, Bld 120 (*)    Total Bilirubin 1.5 (*)    All other components within normal limits  CBC - Abnormal; Notable for the following components:   WBC 14.5 (*)    Hemoglobin 18.1 (*)    MCHC 36.6 (*)    All other components within normal limits  LIPASE, BLOOD  URINALYSIS, COMPLETE (UACMP) WITH MICROSCOPIC    ____________________________________________   PROCEDURES  Procedure(s) performed (including Critical Care):  Procedures   ____________________________________________   INITIAL IMPRESSION / ASSESSMENT AND PLAN / ED COURSE      50 year old male with past medical history of hypertension, GERD, diverticulitis status post large bowel resection, and kidney stones who presents to the ED complaining of 5 days of constant upper abdominal pain associated with nausea, vomiting, and diarrhea.  Pain is reproducible with palpation of his upper abdomen and given his surgical history, we will further assess with CT scan.  Labs thus far are unremarkable, LFTs and lipase within normal limits.  We will treat symptomatically with IV morphine and Phenergan, hydrate with IV fluids.  Differential includes pancreatitis, gastritis, peptic ulcer disease, cholecystitis, biliary colic, gastroenteritis, and bowel obstruction.  CT scan shows fluid-filled dilated loops of bowel without evidence of obstruction, appears consistent with gastroenteritis.  Patient's pain and nausea are improved on reassessment, he was able to tolerate water and crackers without difficulty.  He is appropriate for discharge home and will be prescribed additional Phenergan suppositories.  He was counseled to follow-up with his PCP and to return to the ED for new or worsening symptoms.  Patient agrees with plan.      ____________________________________________   FINAL CLINICAL IMPRESSION(S) / ED DIAGNOSES  Final diagnoses:  Gastroenteritis  Nausea and vomiting, unspecified vomiting type  Epigastric pain     ED Discharge Orders          Ordered    promethazine (PHENERGAN) 25 MG suppository  Every 6 hours PRN        05/12/21 1246             Note:  This document was prepared using Dragon voice recognition software and may include unintentional dictation errors.    Blake Divine, MD 05/12/21 1250

## 2021-05-12 NOTE — ED Triage Notes (Addendum)
Pt here with abd pain since Friday. Pt states pain is upper abd and does not radiate. Pt endorses N/V/D. Pt states bowel obstruction in 2019.

## 2021-05-12 NOTE — ED Notes (Signed)
Pt verbalized understanding of dc instructions. Will follow up with PCP. Refused wheelchair stated he could ambulate to car. Wife with patient to transport home. Denies any other questions or concerns

## 2021-05-12 NOTE — ED Notes (Signed)
See triage note  presents with some abd pain with some n/v/d   states sxs' started about 1 week ago denie snay fever and is currently afebrile

## 2021-05-28 DIAGNOSIS — Z1159 Encounter for screening for other viral diseases: Secondary | ICD-10-CM | POA: Diagnosis not present

## 2021-05-28 DIAGNOSIS — Z114 Encounter for screening for human immunodeficiency virus [HIV]: Secondary | ICD-10-CM | POA: Diagnosis not present

## 2021-05-28 DIAGNOSIS — Z0000 Encounter for general adult medical examination without abnormal findings: Secondary | ICD-10-CM | POA: Diagnosis not present

## 2021-05-28 DIAGNOSIS — Z125 Encounter for screening for malignant neoplasm of prostate: Secondary | ICD-10-CM | POA: Diagnosis not present

## 2021-06-01 DIAGNOSIS — Z23 Encounter for immunization: Secondary | ICD-10-CM | POA: Diagnosis not present

## 2021-06-01 DIAGNOSIS — Z0000 Encounter for general adult medical examination without abnormal findings: Secondary | ICD-10-CM | POA: Diagnosis not present

## 2021-06-01 DIAGNOSIS — F3341 Major depressive disorder, recurrent, in partial remission: Secondary | ICD-10-CM | POA: Diagnosis not present

## 2021-06-01 DIAGNOSIS — G43709 Chronic migraine without aura, not intractable, without status migrainosus: Secondary | ICD-10-CM | POA: Diagnosis not present

## 2021-06-01 DIAGNOSIS — F411 Generalized anxiety disorder: Secondary | ICD-10-CM | POA: Diagnosis not present

## 2021-07-22 DIAGNOSIS — Z1211 Encounter for screening for malignant neoplasm of colon: Secondary | ICD-10-CM | POA: Diagnosis not present

## 2021-07-22 DIAGNOSIS — Z8719 Personal history of other diseases of the digestive system: Secondary | ICD-10-CM | POA: Diagnosis not present

## 2021-07-22 DIAGNOSIS — K219 Gastro-esophageal reflux disease without esophagitis: Secondary | ICD-10-CM | POA: Diagnosis not present

## 2021-08-11 DIAGNOSIS — M546 Pain in thoracic spine: Secondary | ICD-10-CM | POA: Diagnosis not present

## 2021-08-11 DIAGNOSIS — M4604 Spinal enthesopathy, thoracic region: Secondary | ICD-10-CM | POA: Diagnosis not present

## 2021-08-11 DIAGNOSIS — M9902 Segmental and somatic dysfunction of thoracic region: Secondary | ICD-10-CM | POA: Diagnosis not present

## 2021-08-14 DIAGNOSIS — M9903 Segmental and somatic dysfunction of lumbar region: Secondary | ICD-10-CM | POA: Diagnosis not present

## 2021-08-14 DIAGNOSIS — M9902 Segmental and somatic dysfunction of thoracic region: Secondary | ICD-10-CM | POA: Diagnosis not present

## 2021-08-14 DIAGNOSIS — M4604 Spinal enthesopathy, thoracic region: Secondary | ICD-10-CM | POA: Diagnosis not present

## 2021-08-14 DIAGNOSIS — M546 Pain in thoracic spine: Secondary | ICD-10-CM | POA: Diagnosis not present

## 2021-08-17 DIAGNOSIS — M9903 Segmental and somatic dysfunction of lumbar region: Secondary | ICD-10-CM | POA: Diagnosis not present

## 2021-08-17 DIAGNOSIS — M546 Pain in thoracic spine: Secondary | ICD-10-CM | POA: Diagnosis not present

## 2021-08-17 DIAGNOSIS — M9902 Segmental and somatic dysfunction of thoracic region: Secondary | ICD-10-CM | POA: Diagnosis not present

## 2021-08-17 DIAGNOSIS — M4604 Spinal enthesopathy, thoracic region: Secondary | ICD-10-CM | POA: Diagnosis not present

## 2021-08-20 DIAGNOSIS — M4604 Spinal enthesopathy, thoracic region: Secondary | ICD-10-CM | POA: Diagnosis not present

## 2021-08-20 DIAGNOSIS — M546 Pain in thoracic spine: Secondary | ICD-10-CM | POA: Diagnosis not present

## 2021-08-20 DIAGNOSIS — M9902 Segmental and somatic dysfunction of thoracic region: Secondary | ICD-10-CM | POA: Diagnosis not present

## 2021-08-20 DIAGNOSIS — M9903 Segmental and somatic dysfunction of lumbar region: Secondary | ICD-10-CM | POA: Diagnosis not present

## 2021-08-28 DIAGNOSIS — L821 Other seborrheic keratosis: Secondary | ICD-10-CM | POA: Diagnosis not present

## 2021-08-28 DIAGNOSIS — D2262 Melanocytic nevi of left upper limb, including shoulder: Secondary | ICD-10-CM | POA: Diagnosis not present

## 2021-08-28 DIAGNOSIS — D2271 Melanocytic nevi of right lower limb, including hip: Secondary | ICD-10-CM | POA: Diagnosis not present

## 2021-08-28 DIAGNOSIS — D2261 Melanocytic nevi of right upper limb, including shoulder: Secondary | ICD-10-CM | POA: Diagnosis not present

## 2021-08-28 DIAGNOSIS — D225 Melanocytic nevi of trunk: Secondary | ICD-10-CM | POA: Diagnosis not present

## 2021-08-28 DIAGNOSIS — D485 Neoplasm of uncertain behavior of skin: Secondary | ICD-10-CM | POA: Diagnosis not present

## 2021-08-28 DIAGNOSIS — D2272 Melanocytic nevi of left lower limb, including hip: Secondary | ICD-10-CM | POA: Diagnosis not present

## 2021-09-08 DIAGNOSIS — K449 Diaphragmatic hernia without obstruction or gangrene: Secondary | ICD-10-CM | POA: Diagnosis not present

## 2021-09-08 DIAGNOSIS — Z1211 Encounter for screening for malignant neoplasm of colon: Secondary | ICD-10-CM | POA: Diagnosis not present

## 2021-09-08 DIAGNOSIS — K640 First degree hemorrhoids: Secondary | ICD-10-CM | POA: Diagnosis not present

## 2021-09-08 DIAGNOSIS — K2970 Gastritis, unspecified, without bleeding: Secondary | ICD-10-CM | POA: Diagnosis not present

## 2021-09-08 DIAGNOSIS — K5730 Diverticulosis of large intestine without perforation or abscess without bleeding: Secondary | ICD-10-CM | POA: Diagnosis not present

## 2021-09-08 DIAGNOSIS — Z980 Intestinal bypass and anastomosis status: Secondary | ICD-10-CM | POA: Diagnosis not present

## 2021-10-15 DIAGNOSIS — F410 Panic disorder [episodic paroxysmal anxiety] without agoraphobia: Secondary | ICD-10-CM | POA: Diagnosis not present

## 2021-10-15 DIAGNOSIS — F331 Major depressive disorder, recurrent, moderate: Secondary | ICD-10-CM | POA: Diagnosis not present

## 2021-10-15 DIAGNOSIS — F411 Generalized anxiety disorder: Secondary | ICD-10-CM | POA: Diagnosis not present

## 2021-10-15 DIAGNOSIS — F5105 Insomnia due to other mental disorder: Secondary | ICD-10-CM | POA: Diagnosis not present

## 2021-11-04 DIAGNOSIS — F410 Panic disorder [episodic paroxysmal anxiety] without agoraphobia: Secondary | ICD-10-CM | POA: Diagnosis not present

## 2021-11-04 DIAGNOSIS — F5105 Insomnia due to other mental disorder: Secondary | ICD-10-CM | POA: Diagnosis not present

## 2021-11-04 DIAGNOSIS — F411 Generalized anxiety disorder: Secondary | ICD-10-CM | POA: Diagnosis not present

## 2021-11-04 DIAGNOSIS — F331 Major depressive disorder, recurrent, moderate: Secondary | ICD-10-CM | POA: Diagnosis not present

## 2021-11-06 DIAGNOSIS — F331 Major depressive disorder, recurrent, moderate: Secondary | ICD-10-CM | POA: Diagnosis not present

## 2021-11-06 DIAGNOSIS — F410 Panic disorder [episodic paroxysmal anxiety] without agoraphobia: Secondary | ICD-10-CM | POA: Diagnosis not present

## 2021-11-21 DIAGNOSIS — F5105 Insomnia due to other mental disorder: Secondary | ICD-10-CM | POA: Diagnosis not present

## 2021-11-21 DIAGNOSIS — F410 Panic disorder [episodic paroxysmal anxiety] without agoraphobia: Secondary | ICD-10-CM | POA: Diagnosis not present

## 2021-11-21 DIAGNOSIS — F411 Generalized anxiety disorder: Secondary | ICD-10-CM | POA: Diagnosis not present

## 2021-11-21 DIAGNOSIS — F331 Major depressive disorder, recurrent, moderate: Secondary | ICD-10-CM | POA: Diagnosis not present

## 2021-12-07 DIAGNOSIS — F411 Generalized anxiety disorder: Secondary | ICD-10-CM | POA: Diagnosis not present

## 2021-12-07 DIAGNOSIS — F331 Major depressive disorder, recurrent, moderate: Secondary | ICD-10-CM | POA: Diagnosis not present

## 2021-12-07 DIAGNOSIS — F410 Panic disorder [episodic paroxysmal anxiety] without agoraphobia: Secondary | ICD-10-CM | POA: Diagnosis not present

## 2021-12-10 DIAGNOSIS — G43709 Chronic migraine without aura, not intractable, without status migrainosus: Secondary | ICD-10-CM | POA: Diagnosis not present

## 2021-12-10 DIAGNOSIS — E782 Mixed hyperlipidemia: Secondary | ICD-10-CM | POA: Diagnosis not present

## 2021-12-10 DIAGNOSIS — F3341 Major depressive disorder, recurrent, in partial remission: Secondary | ICD-10-CM | POA: Diagnosis not present

## 2021-12-10 DIAGNOSIS — F411 Generalized anxiety disorder: Secondary | ICD-10-CM | POA: Diagnosis not present

## 2021-12-10 DIAGNOSIS — Z0000 Encounter for general adult medical examination without abnormal findings: Secondary | ICD-10-CM | POA: Diagnosis not present

## 2021-12-10 DIAGNOSIS — R5382 Chronic fatigue, unspecified: Secondary | ICD-10-CM | POA: Diagnosis not present

## 2021-12-10 DIAGNOSIS — Z125 Encounter for screening for malignant neoplasm of prostate: Secondary | ICD-10-CM | POA: Diagnosis not present

## 2021-12-21 DIAGNOSIS — F410 Panic disorder [episodic paroxysmal anxiety] without agoraphobia: Secondary | ICD-10-CM | POA: Diagnosis not present

## 2021-12-21 DIAGNOSIS — F411 Generalized anxiety disorder: Secondary | ICD-10-CM | POA: Diagnosis not present

## 2021-12-21 DIAGNOSIS — F5105 Insomnia due to other mental disorder: Secondary | ICD-10-CM | POA: Diagnosis not present

## 2021-12-21 DIAGNOSIS — F331 Major depressive disorder, recurrent, moderate: Secondary | ICD-10-CM | POA: Diagnosis not present

## 2021-12-24 DIAGNOSIS — F331 Major depressive disorder, recurrent, moderate: Secondary | ICD-10-CM | POA: Diagnosis not present

## 2021-12-24 DIAGNOSIS — F411 Generalized anxiety disorder: Secondary | ICD-10-CM | POA: Diagnosis not present

## 2021-12-24 DIAGNOSIS — F410 Panic disorder [episodic paroxysmal anxiety] without agoraphobia: Secondary | ICD-10-CM | POA: Diagnosis not present

## 2022-01-08 DIAGNOSIS — F331 Major depressive disorder, recurrent, moderate: Secondary | ICD-10-CM | POA: Diagnosis not present

## 2022-01-08 DIAGNOSIS — F411 Generalized anxiety disorder: Secondary | ICD-10-CM | POA: Diagnosis not present

## 2022-01-08 DIAGNOSIS — F410 Panic disorder [episodic paroxysmal anxiety] without agoraphobia: Secondary | ICD-10-CM | POA: Diagnosis not present

## 2022-02-03 DIAGNOSIS — F5105 Insomnia due to other mental disorder: Secondary | ICD-10-CM | POA: Diagnosis not present

## 2022-02-03 DIAGNOSIS — F410 Panic disorder [episodic paroxysmal anxiety] without agoraphobia: Secondary | ICD-10-CM | POA: Diagnosis not present

## 2022-02-03 DIAGNOSIS — F411 Generalized anxiety disorder: Secondary | ICD-10-CM | POA: Diagnosis not present

## 2022-02-03 DIAGNOSIS — F331 Major depressive disorder, recurrent, moderate: Secondary | ICD-10-CM | POA: Diagnosis not present

## 2022-02-16 ENCOUNTER — Encounter: Payer: Self-pay | Admitting: *Deleted

## 2022-02-16 ENCOUNTER — Other Ambulatory Visit: Payer: Self-pay

## 2022-02-16 ENCOUNTER — Emergency Department
Admission: EM | Admit: 2022-02-16 | Discharge: 2022-02-16 | Disposition: A | Discharge status: Home / Self Care | Payer: PPO | Attending: Emergency Medicine | Admitting: Emergency Medicine

## 2022-02-16 ENCOUNTER — Emergency Department: Payer: PPO

## 2022-02-16 DIAGNOSIS — R1031 Right lower quadrant pain: Secondary | ICD-10-CM | POA: Diagnosis not present

## 2022-02-16 DIAGNOSIS — K760 Fatty (change of) liver, not elsewhere classified: Secondary | ICD-10-CM | POA: Diagnosis not present

## 2022-02-16 DIAGNOSIS — R109 Unspecified abdominal pain: Secondary | ICD-10-CM | POA: Diagnosis present

## 2022-02-16 LAB — CBC
HCT: 51.5 % (ref 39.0–52.0)
Hemoglobin: 17.4 g/dL — ABNORMAL HIGH (ref 13.0–17.0)
MCH: 29.3 pg (ref 26.0–34.0)
MCHC: 33.8 g/dL (ref 30.0–36.0)
MCV: 86.8 fL (ref 80.0–100.0)
Platelets: 320 10*3/uL (ref 150–400)
RBC: 5.93 MIL/uL — ABNORMAL HIGH (ref 4.22–5.81)
RDW: 12.5 % (ref 11.5–15.5)
WBC: 12.9 10*3/uL — ABNORMAL HIGH (ref 4.0–10.5)
nRBC: 0 % (ref 0.0–0.2)

## 2022-02-16 LAB — URINALYSIS, ROUTINE W REFLEX MICROSCOPIC
Bilirubin Urine: NEGATIVE
Glucose, UA: NEGATIVE mg/dL
Hgb urine dipstick: NEGATIVE
Ketones, ur: NEGATIVE mg/dL
Leukocytes,Ua: NEGATIVE
Nitrite: NEGATIVE
Protein, ur: NEGATIVE mg/dL
Specific Gravity, Urine: 1.016 (ref 1.005–1.030)
pH: 7 (ref 5.0–8.0)

## 2022-02-16 LAB — COMPREHENSIVE METABOLIC PANEL
ALT: 31 U/L (ref 0–44)
AST: 26 U/L (ref 15–41)
Albumin: 4.6 g/dL (ref 3.5–5.0)
Alkaline Phosphatase: 50 U/L (ref 38–126)
Anion gap: 8 (ref 5–15)
BUN: 10 mg/dL (ref 6–20)
CO2: 26 mmol/L (ref 22–32)
Calcium: 9.6 mg/dL (ref 8.9–10.3)
Chloride: 106 mmol/L (ref 98–111)
Creatinine, Ser: 1.06 mg/dL (ref 0.61–1.24)
GFR, Estimated: >60 mL/min (ref >60)
Glucose, Bld: 119 mg/dL — ABNORMAL HIGH (ref 70–99)
Potassium: 5.2 mmol/L — ABNORMAL HIGH (ref 3.5–5.1)
Sodium: 140 mmol/L (ref 135–145)
Total Bilirubin: 1.3 mg/dL — ABNORMAL HIGH (ref 0.3–1.2)
Total Protein: 6.9 g/dL (ref 6.5–8.1)

## 2022-02-16 LAB — LIPASE, BLOOD: Lipase: 30 U/L (ref 11–51)

## 2022-02-16 MED ORDER — MORPHINE SULFATE (PF) 4 MG/ML IV SOLN
4.0000 mg | Freq: Once | INTRAVENOUS | Status: AC
  Administered 2022-02-16: 4 mg via INTRAVENOUS
  Filled 2022-02-16: qty 1

## 2022-02-16 MED ORDER — IOHEXOL 300 MG/ML  SOLN
100.0000 mL | Freq: Once | INTRAMUSCULAR | Status: AC | PRN
Start: 2022-02-16 — End: 2022-02-16
  Administered 2022-02-16: 100 mL via INTRAVENOUS

## 2022-02-16 MED ORDER — METRONIDAZOLE 500 MG PO TABS
500.0000 mg | ORAL_TABLET | Freq: Once | ORAL | Status: AC
  Administered 2022-02-16: 500 mg via ORAL
  Filled 2022-02-16: qty 1

## 2022-02-16 MED ORDER — OXYCODONE-ACETAMINOPHEN 5-325 MG PO TABS
1.0000 | ORAL_TABLET | Freq: Once | ORAL | Status: AC
  Administered 2022-02-16: 1 via ORAL
  Filled 2022-02-16: qty 1

## 2022-02-16 MED ORDER — ONDANSETRON 4 MG PO TBDP
4.0000 mg | ORAL_TABLET | Freq: Once | ORAL | Status: AC
  Administered 2022-02-16: 4 mg via ORAL
  Filled 2022-02-16: qty 1

## 2022-02-16 MED ORDER — METRONIDAZOLE 500 MG PO TABS: 500.0000 mg | ORAL_TABLET | Freq: Three times a day (TID) | ORAL | 0 refills | Status: AC

## 2022-02-16 MED ORDER — OXYCODONE-ACETAMINOPHEN 5-325 MG PO TABS: 1.0000 | ORAL_TABLET | Freq: Four times a day (QID) | ORAL | 0 refills | Status: AC | PRN

## 2022-02-16 MED ORDER — CIPROFLOXACIN HCL 500 MG PO TABS: 500.0000 mg | ORAL_TABLET | Freq: Two times a day (BID) | ORAL | 0 refills | Status: AC

## 2022-02-16 MED ORDER — CIPROFLOXACIN HCL 500 MG PO TABS
500.0000 mg | ORAL_TABLET | Freq: Once | ORAL | Status: AC
  Administered 2022-02-16: 500 mg via ORAL
  Filled 2022-02-16: qty 1

## 2022-02-16 MED ORDER — ONDANSETRON 4 MG PO TBDP: 4.0000 mg | ORAL_TABLET | Freq: Three times a day (TID) | ORAL | 0 refills | Status: AC | PRN

## 2022-02-16 MED ORDER — ONDANSETRON HCL 4 MG/2ML IJ SOLN
4.0000 mg | Freq: Once | INTRAMUSCULAR | Status: AC
  Administered 2022-02-16: 4 mg via INTRAVENOUS
  Filled 2022-02-16: qty 2

## 2022-02-16 NOTE — Discharge Instructions (Signed)
Take Cipro twice daily for 5 days. Take Flagyl 3 times daily for the next 5 days. You can take Percocet for pain every 6 hours with Zofran as needed for nausea. Percocet can be constipating, so please take MiraLAX daily if you notice constipation.

## 2022-02-16 NOTE — ED Provider Notes (Signed)
[facility] Provider Note  Patient Contact: 9:40 PM (approximate)   History   Abdominal Pain   HPI  Jonathan Aguirre is a 51 y.o. male with a history of diverticulitis with perforation and prior small bowel obstruction in 2019, presents to the emergency department with periumbilical and right lower quadrant abdominal cramping that started this morning with 3-4 episodes of emesis throughout the day.  Patient denies fever and chills.  He denies chest pain, chest tightness or shortness of breath.  No associated diarrhea.      Physical Exam   Triage Vital Signs: ED Triage Vitals  Enc Vitals Group     BP 02/16/22 2018 (!) 142/105     Pulse Rate 02/16/22 2018 (!) 106     Resp 02/16/22 2018 18     Temp 02/16/22 2018 97.8 F (36.6 C)     Temp Source 02/16/22 2018 Oral     SpO2 02/16/22 2018 97 %     Weight 02/16/22 2015 295 lb (133.8 kg)     Height 02/16/22 2015 6\' 1"  (1.854 m)     Head Circumference --      Peak Flow --      Pain Score 02/16/22 2015 8     Pain Loc --      Pain Edu? --      Excl. in [place]? --     Most recent vital signs: Vitals:   02/16/22 2018 02/16/22 2303  BP: (!) 142/105 133/89  Pulse: (!) 106 82  Resp: 18 17  Temp: 97.8 F (36.6 C) 98 F (36.7 C)  SpO2: 97% 98%     General: Alert and in no acute distress. Eyes:  PERRL. EOMI. Head: No acute traumatic findings ENT:      Nose: No congestion/rhinnorhea.      Mouth/Throat: Mucous membranes are moist. Neck: No stridor. No cervical spine tenderness to palpation. Cardiovascular:  Good peripheral perfusion Respiratory: Normal respiratory effort without tachypnea or retractions. Lungs CTAB. Good air entry to the bases with no decreased or absent breath sounds. Gastrointestinal: Bowel sounds 4 quadrants.  Patient has right lower quadrant and periumbilical tenderness with guarding. Musculoskeletal: Full range of motion to all extremities.  Neurologic:  No gross focal  neurologic deficits are appreciated.  Skin:   No rash noted Other:   ED Results / Procedures / Treatments   Labs (all labs ordered are listed, but only abnormal results are displayed) Labs Reviewed  COMPREHENSIVE METABOLIC PANEL - Abnormal; Notable for the following components:      Result Value   Potassium 5.2 (*)    Glucose, Bld 119 (*)    Total Bilirubin 1.3 (*)    All other components within normal limits  CBC - Abnormal; Notable for the following components:   WBC 12.9 (*)    RBC 5.93 (*)    Hemoglobin 17.4 (*)    All other components within normal limits  URINALYSIS, ROUTINE W REFLEX MICROSCOPIC - Abnormal; Notable for the following components:   Color, Urine YELLOW (*)    APPearance HAZY (*)    All other components within normal limits  LIPASE, BLOOD        RADIOLOGY  I personally viewed and evaluated these images as part of my medical decision making, as well as reviewing the written report by the radiologist.  ED Provider Interpretation: Patient has fluid-filled small bowel with mild surrounding mesenteric edema suggestive of enteritis   PROCEDURES:  Critical Care performed: No  Procedures   MEDICATIONS ORDERED IN ED: Medications  morphine (PF) 4 MG/ML injection 4 mg (4 mg Intravenous Given 02/16/22 2148)  ondansetron (ZOFRAN) injection 4 mg (4 mg Intravenous Given 02/16/22 2147)  iohexol (OMNIPAQUE) 300 MG/ML solution 100 mL (100 mLs Intravenous Contrast Given 02/16/22 2158)  ciprofloxacin (CIPRO) tablet 500 mg (500 mg Oral Given 02/16/22 2254)  metroNIDAZOLE (FLAGYL) tablet 500 mg (500 mg Oral Given 02/16/22 2254)  oxyCODONE-acetaminophen (PERCOCET/ROXICET) 5-325 MG per tablet 1 tablet (1 tablet Oral Given 02/16/22 2254)  ondansetron (ZOFRAN-ODT) disintegrating tablet 4 mg (4 mg Oral Given 02/16/22 2254)     IMPRESSION / MDM / ASSESSMENT AND PLAN / ED COURSE  I reviewed the triage vital signs and the nursing notes.                               Assessment and plan Abdominal pain 51 year old male with history of diverticulitis and prior bowel obstruction presents to the emergency department with periumbilical and right lower quadrant abdominal pain that started this morning with cramping and has had several episodes of vomiting throughout the day.  Patient was mildly tachycardic and hypertensive at triage but vital signs otherwise reassuring.  On exam, patient seemed uncomfortable and had right lower quadrant and periumbilical tenderness with guarding.  Differential diagnosis at this time includes appendicitis, bowel obstruction, diverticulitis, intra-abdominal abscess, nephrolithiasis...  We will order CT abdomen pelvis and will reassess.   Patient has fluid-filled small bowel with surrounding mesenteric edema concerning for enteritis.  We will treat patient with Cipro and Flagyl and a short course of Percocet for pain.  Patient was cautioned to return to the emergency department if his symptoms do not seem to be improving.  All patient questions were answered.  FINAL CLINICAL IMPRESSION(S) / ED DIAGNOSES   Final diagnoses:  Right lower quadrant abdominal pain     Rx / DC Orders   ED Discharge Orders          Ordered    ciprofloxacin (CIPRO) 500 MG tablet  2 times daily        02/16/22 2238    metroNIDAZOLE (FLAGYL) 500 MG tablet  3 times daily        02/16/22 2238    oxyCODONE-acetaminophen (PERCOCET/ROXICET) 5-325 MG tablet  Every 6 hours PRN        02/16/22 2239    ondansetron (ZOFRAN-ODT) 4 MG disintegrating tablet  Every 8 hours PRN        02/16/22 2239             Note:  This document was prepared using Dragon voice recognition software and may include unintentional dictation errors.   Pia Mau [place], PA-C 02/16/22 2317    Gilles Chiquito, MD 02/17/22 [phone]

## 2022-02-16 NOTE — ED Triage Notes (Signed)
Pt has abd pain with n/v/d.  Pain began this am.  No otc meds.  Pt took zofran with some relief.  Pt reports cramping type pain.  No back pain.  Denies urinary sx.  Pt alert  speech clear.

## 2022-02-17 DIAGNOSIS — F331 Major depressive disorder, recurrent, moderate: Secondary | ICD-10-CM | POA: Diagnosis not present

## 2022-02-17 DIAGNOSIS — F410 Panic disorder [episodic paroxysmal anxiety] without agoraphobia: Secondary | ICD-10-CM | POA: Diagnosis not present

## 2022-02-17 DIAGNOSIS — F411 Generalized anxiety disorder: Secondary | ICD-10-CM | POA: Diagnosis not present

## 2022-03-04 ENCOUNTER — Other Ambulatory Visit: Payer: Self-pay | Admitting: Family Medicine

## 2022-03-04 ENCOUNTER — Ambulatory Visit
Admission: RE | Admit: 2022-03-04 | Discharge: 2022-03-04 | Disposition: A | Discharge status: Home / Self Care | Payer: PPO | Source: Ambulatory Visit | Attending: Family Medicine | Admitting: Family Medicine

## 2022-03-04 DIAGNOSIS — R1013 Epigastric pain: Secondary | ICD-10-CM | POA: Insufficient documentation

## 2022-03-04 DIAGNOSIS — R6883 Chills (without fever): Secondary | ICD-10-CM

## 2022-03-04 DIAGNOSIS — R112 Nausea with vomiting, unspecified: Secondary | ICD-10-CM | POA: Diagnosis not present

## 2022-03-04 DIAGNOSIS — K760 Fatty (change of) liver, not elsewhere classified: Secondary | ICD-10-CM | POA: Diagnosis not present

## 2022-03-04 DIAGNOSIS — R1032 Left lower quadrant pain: Secondary | ICD-10-CM

## 2022-03-04 DIAGNOSIS — N200 Calculus of kidney: Secondary | ICD-10-CM | POA: Diagnosis not present

## 2022-03-04 DIAGNOSIS — N281 Cyst of kidney, acquired: Secondary | ICD-10-CM | POA: Diagnosis not present

## 2022-03-04 MED ORDER — IOHEXOL 350 MG/ML SOLN
100.0000 mL | Freq: Once | INTRAVENOUS | Status: AC | PRN
  Administered 2022-03-04: 100 mL via INTRAVENOUS

## 2022-03-17 DIAGNOSIS — F410 Panic disorder [episodic paroxysmal anxiety] without agoraphobia: Secondary | ICD-10-CM | POA: Diagnosis not present

## 2022-03-17 DIAGNOSIS — F411 Generalized anxiety disorder: Secondary | ICD-10-CM | POA: Diagnosis not present

## 2022-03-17 DIAGNOSIS — F331 Major depressive disorder, recurrent, moderate: Secondary | ICD-10-CM | POA: Diagnosis not present

## 2022-04-08 DIAGNOSIS — F5105 Insomnia due to other mental disorder: Secondary | ICD-10-CM | POA: Diagnosis not present

## 2022-04-08 DIAGNOSIS — F411 Generalized anxiety disorder: Secondary | ICD-10-CM | POA: Diagnosis not present

## 2022-04-08 DIAGNOSIS — F410 Panic disorder [episodic paroxysmal anxiety] without agoraphobia: Secondary | ICD-10-CM | POA: Diagnosis not present

## 2022-04-08 DIAGNOSIS — F331 Major depressive disorder, recurrent, moderate: Secondary | ICD-10-CM | POA: Diagnosis not present

## 2022-04-16 DIAGNOSIS — F411 Generalized anxiety disorder: Secondary | ICD-10-CM | POA: Diagnosis not present

## 2022-04-16 DIAGNOSIS — F410 Panic disorder [episodic paroxysmal anxiety] without agoraphobia: Secondary | ICD-10-CM | POA: Diagnosis not present

## 2022-04-16 DIAGNOSIS — F331 Major depressive disorder, recurrent, moderate: Secondary | ICD-10-CM | POA: Diagnosis not present

## 2022-06-01 DIAGNOSIS — R519 Headache, unspecified: Secondary | ICD-10-CM | POA: Diagnosis not present

## 2022-06-01 DIAGNOSIS — R0683 Snoring: Secondary | ICD-10-CM | POA: Diagnosis not present

## 2022-06-01 DIAGNOSIS — G4710 Hypersomnia, unspecified: Secondary | ICD-10-CM | POA: Diagnosis not present

## 2022-06-03 DIAGNOSIS — G4733 Obstructive sleep apnea (adult) (pediatric): Secondary | ICD-10-CM | POA: Diagnosis not present

## 2022-06-04 DIAGNOSIS — F410 Panic disorder [episodic paroxysmal anxiety] without agoraphobia: Secondary | ICD-10-CM | POA: Diagnosis not present

## 2022-06-04 DIAGNOSIS — F331 Major depressive disorder, recurrent, moderate: Secondary | ICD-10-CM | POA: Diagnosis not present

## 2022-06-04 DIAGNOSIS — F411 Generalized anxiety disorder: Secondary | ICD-10-CM | POA: Diagnosis not present

## 2022-06-07 DIAGNOSIS — Z0000 Encounter for general adult medical examination without abnormal findings: Secondary | ICD-10-CM | POA: Diagnosis not present

## 2022-06-07 DIAGNOSIS — Z125 Encounter for screening for malignant neoplasm of prostate: Secondary | ICD-10-CM | POA: Diagnosis not present

## 2022-06-07 DIAGNOSIS — E782 Mixed hyperlipidemia: Secondary | ICD-10-CM | POA: Diagnosis not present

## 2022-06-07 DIAGNOSIS — R5382 Chronic fatigue, unspecified: Secondary | ICD-10-CM | POA: Diagnosis not present

## 2022-06-14 DIAGNOSIS — F3341 Major depressive disorder, recurrent, in partial remission: Secondary | ICD-10-CM | POA: Diagnosis not present

## 2022-06-14 DIAGNOSIS — F411 Generalized anxiety disorder: Secondary | ICD-10-CM | POA: Diagnosis not present

## 2022-06-14 DIAGNOSIS — G43709 Chronic migraine without aura, not intractable, without status migrainosus: Secondary | ICD-10-CM | POA: Diagnosis not present

## 2022-06-14 DIAGNOSIS — Z0000 Encounter for general adult medical examination without abnormal findings: Secondary | ICD-10-CM | POA: Diagnosis not present

## 2022-06-14 DIAGNOSIS — G4733 Obstructive sleep apnea (adult) (pediatric): Secondary | ICD-10-CM | POA: Diagnosis not present

## 2022-06-14 DIAGNOSIS — E782 Mixed hyperlipidemia: Secondary | ICD-10-CM | POA: Diagnosis not present

## 2022-07-08 DIAGNOSIS — F410 Panic disorder [episodic paroxysmal anxiety] without agoraphobia: Secondary | ICD-10-CM | POA: Diagnosis not present

## 2022-07-08 DIAGNOSIS — F411 Generalized anxiety disorder: Secondary | ICD-10-CM | POA: Diagnosis not present

## 2022-07-08 DIAGNOSIS — F5105 Insomnia due to other mental disorder: Secondary | ICD-10-CM | POA: Diagnosis not present

## 2022-07-08 DIAGNOSIS — F331 Major depressive disorder, recurrent, moderate: Secondary | ICD-10-CM | POA: Diagnosis not present

## 2022-07-12 DIAGNOSIS — F410 Panic disorder [episodic paroxysmal anxiety] without agoraphobia: Secondary | ICD-10-CM | POA: Diagnosis not present

## 2022-07-12 DIAGNOSIS — F411 Generalized anxiety disorder: Secondary | ICD-10-CM | POA: Diagnosis not present

## 2022-07-12 DIAGNOSIS — F331 Major depressive disorder, recurrent, moderate: Secondary | ICD-10-CM | POA: Diagnosis not present

## 2022-08-25 DIAGNOSIS — J40 Bronchitis, not specified as acute or chronic: Secondary | ICD-10-CM | POA: Diagnosis not present

## 2022-08-25 DIAGNOSIS — R0602 Shortness of breath: Secondary | ICD-10-CM | POA: Diagnosis not present

## 2022-08-25 DIAGNOSIS — R059 Cough, unspecified: Secondary | ICD-10-CM | POA: Diagnosis not present

## 2022-08-30 DIAGNOSIS — F410 Panic disorder [episodic paroxysmal anxiety] without agoraphobia: Secondary | ICD-10-CM | POA: Diagnosis not present

## 2022-08-30 DIAGNOSIS — F331 Major depressive disorder, recurrent, moderate: Secondary | ICD-10-CM | POA: Diagnosis not present

## 2022-08-30 DIAGNOSIS — F411 Generalized anxiety disorder: Secondary | ICD-10-CM | POA: Diagnosis not present

## 2022-09-20 DIAGNOSIS — F331 Major depressive disorder, recurrent, moderate: Secondary | ICD-10-CM | POA: Diagnosis not present

## 2022-09-20 DIAGNOSIS — F410 Panic disorder [episodic paroxysmal anxiety] without agoraphobia: Secondary | ICD-10-CM | POA: Diagnosis not present

## 2022-09-20 DIAGNOSIS — F411 Generalized anxiety disorder: Secondary | ICD-10-CM | POA: Diagnosis not present

## 2022-10-05 DIAGNOSIS — F5105 Insomnia due to other mental disorder: Secondary | ICD-10-CM | POA: Diagnosis not present

## 2022-10-05 DIAGNOSIS — F411 Generalized anxiety disorder: Secondary | ICD-10-CM | POA: Diagnosis not present

## 2022-10-05 DIAGNOSIS — F410 Panic disorder [episodic paroxysmal anxiety] without agoraphobia: Secondary | ICD-10-CM | POA: Diagnosis not present

## 2022-10-05 DIAGNOSIS — F331 Major depressive disorder, recurrent, moderate: Secondary | ICD-10-CM | POA: Diagnosis not present

## 2022-10-14 DIAGNOSIS — G4733 Obstructive sleep apnea (adult) (pediatric): Secondary | ICD-10-CM | POA: Diagnosis not present

## 2022-10-20 DIAGNOSIS — F411 Generalized anxiety disorder: Secondary | ICD-10-CM | POA: Diagnosis not present

## 2022-10-20 DIAGNOSIS — F410 Panic disorder [episodic paroxysmal anxiety] without agoraphobia: Secondary | ICD-10-CM | POA: Diagnosis not present

## 2022-10-20 DIAGNOSIS — F331 Major depressive disorder, recurrent, moderate: Secondary | ICD-10-CM | POA: Diagnosis not present

## 2022-11-05 DIAGNOSIS — G4733 Obstructive sleep apnea (adult) (pediatric): Secondary | ICD-10-CM | POA: Diagnosis not present

## 2022-11-14 DIAGNOSIS — G4733 Obstructive sleep apnea (adult) (pediatric): Secondary | ICD-10-CM | POA: Diagnosis not present

## 2022-11-21 ENCOUNTER — Ambulatory Visit
Admission: EM | Admit: 2022-11-21 | Discharge: 2022-11-21 | Disposition: A | Discharge status: Home / Self Care | Payer: PPO | Attending: Physician Assistant | Admitting: Physician Assistant

## 2022-11-21 DIAGNOSIS — G43711 Chronic migraine without aura, intractable, with status migrainosus: Secondary | ICD-10-CM | POA: Diagnosis not present

## 2022-11-21 MED ORDER — BACLOFEN 10 MG PO TABS: 10.0000 mg | ORAL_TABLET | Freq: Two times a day (BID) | ORAL | 0 refills | Status: DC | PRN

## 2022-11-21 MED ORDER — BUTALBITAL-APAP-CAFFEINE 50-325-40 MG PO TABS: 1.0000 | ORAL_TABLET | Freq: Two times a day (BID) | ORAL | 0 refills | Status: AC | PRN

## 2022-11-21 MED ORDER — KETOROLAC TROMETHAMINE 30 MG/ML IJ SOLN
15.0000 mg | Freq: Once | INTRAMUSCULAR | Status: AC
  Administered 2022-11-21: 15 mg via INTRAMUSCULAR

## 2022-11-21 MED ORDER — DEXAMETHASONE SODIUM PHOSPHATE 10 MG/ML IJ SOLN
10.0000 mg | Freq: Once | INTRAMUSCULAR | Status: AC
  Administered 2022-11-21: 10 mg via INTRAMUSCULAR

## 2022-11-21 NOTE — ED Triage Notes (Signed)
Pt c/o headache, nausesa x3weeks  Pt states that the pain started in the back of the neck but went around his head and behind the eyes  Pt has been using Maxalt for migraines but it is not helping.

## 2022-11-21 NOTE — Discharge Instructions (Addendum)
As we discussed, if anything changes or worsens and you have worsening headache, weakness, vision change, trouble finding your words, worst headache of your life you need to go to the emergency room immediately.  I would like you to follow-up with neurology.  Call them to schedule an appointment.  We gave you an injection of Toradol today.  Please do not take NSAIDs for the next 24 hours including aspirin, ibuprofen/Advil, naproxen/Aleve.  Use baclofen to help with your pain.  This replaces your Flexeril.  It will make you sleepy so do not drive or drink alcohol while taking it.  You can use Fioricet up to twice a day.  This can also make you sleepy so do not combine it with baclofen and make sure not to drive or drink alcohol taking it.  Make sure you rest and drink plenty of fluids.  If anything changes go to the ER.

## 2022-11-21 NOTE — ED Provider Notes (Signed)
[facility]    CSN: [id number] Arrival date & time: 11/21/22  1033      History   Chief Complaint Chief Complaint  Patient presents with   Headache   Nausea    HPI Jonathan Aguirre is a 52 y.o. male.   Patient presents today with 3-week history of persistent migraine.  Reports that he has a history of chronic migraine generally these resolve after a few days patient.  He denies any increased stressors, recent illness or additional symptoms such as cough or congestion, medication changes, head injury.  He has been taking Maxalt as well as ibuprofen and Tylenol without improvement of symptoms.  He has Zofran available for nausea.  He reports that headache is rated 8 on a 0-10 pain scale, described as throbbing, localized to his occiput with radiation throughout his head and behind his eyes, no alleviating factors identified.  He reports that over-the-counter analgesics and previously prescribed medication to help manage the pain (bring this down to a level of 2/3) but have not resolved it.  He has not seen a neurologist recently.  Denies any recent head imaging.  He denies any associated fever, nausea, vomiting, dysarthria, visual disturbance, focal weakness.  Denies history of aura.  He does report associated nausea and photophobia.  This is not the worst headache of his life.  Last dose of ibuprofen was around 7 AM this morning.    Past Medical History:  Diagnosis Date   Anxiety and depression    panic attacks   Diverticulitis    GERD (gastroesophageal reflux disease)    Hypertension    Nephrolithiasis     Patient Active Problem List   Diagnosis Date Noted   Left ureteral stone 06/28/2020   Shortness of breath 12/14/2016   Hypertension 12/14/2016   Chest pain 12/14/2016   Family history of premature CAD 12/14/2016   Morbid obesity 12/14/2016   Anxiety 12/14/2016   Dizziness 12/14/2016    Past Surgical History:  Procedure Laterality Date   COLON SURGERY      CYSTOSCOPY WITH STENT PLACEMENT Left 06/28/2020   Procedure: CYSTOSCOPY WITH STENT PLACEMENT;  Surgeon: Bjorn Pippin, MD;  Location: [facility];  Service: Urology;  Laterality: Left;   CYSTOSCOPY/RETROGRADE/URETEROSCOPY/STONE EXTRACTION WITH BASKET  2016   CYSTOSCOPY/URETEROSCOPY/HOLMIUM LASER Left 06/28/2020   Procedure: CYSTOSCOPY/URETEROSCOPY;  Surgeon: Bjorn Pippin, MD;  Location: [facility];  Service: Urology;  Laterality: Left;   CYSTOSCOPY/URETEROSCOPY/HOLMIUM LASER/STENT PLACEMENT Left 07/07/2020   Procedure: CYSTOSCOPY/URETEROSCOPY/HOLMIUM LASER/STENT Exchange;  Surgeon: Vanna Scotland, MD;  Location: [facility];  Service: Urology;  Laterality: Left;       Home Medications    Prior to Admission medications   Medication Sig Start Date End Date Taking? Authorizing Provider  baclofen (LIORESAL) 10 MG tablet Take 1 tablet (10 mg total) by mouth 2 (two) times daily as needed for muscle spasms. 11/21/22  Yes Brittin Janik, Noberto Retort, PA-C  butalbital-acetaminophen-caffeine (FIORICET) 50-325-40 MG tablet Take 1 tablet by mouth 2 (two) times daily as needed for up to 3 days for headache. 11/21/22 11/24/22 Yes Dorthey Depace K, PA-C  hydrOXYzine (ATARAX/VISTARIL) 50 MG tablet Take 50 mg by mouth every 8 (eight) hours as needed for anxiety. 06/26/20   [provider]  lamoTRIgine (LAMICTAL) 100 MG tablet Take 100 mg by mouth daily.    [provider]  lamoTRIgine (LAMICTAL) 150 MG tablet Take 150 mg by mouth daily. 06/18/20   [provider]  metoprolol succinate (TOPROL-XL) 25 MG 24 hr tablet  Take 25 mg by mouth daily.  11/11/17   [provider]  Multiple Vitamin (MULTIVITAMIN) tablet Take 1 tablet by mouth daily.    [provider]  omeprazole (PRILOSEC) 20 MG capsule Take 20 mg by mouth 2 (two) times daily before a meal.    [provider]  promethazine (PHENERGAN) 25 MG suppository Place 1 suppository (25 mg total) rectally every 6 (six) hours as needed  for nausea or vomiting. 05/12/21   Chesley Noon, MD  rosuvastatin (CRESTOR) 5 MG tablet Take 1 tablet (5 mg total) by mouth daily. 04/18/18   Antonieta Iba, MD  traZODone (DESYREL) 100 MG tablet Take 200 mg by mouth at bedtime.    [provider]    Family History Family History  Problem Relation Age of Onset   Heart failure Mother    Hyperlipidemia Mother    Hypertension Mother    Prostate cancer Neg Hx    Bladder Cancer Neg Hx    Kidney cancer Neg Hx     Social History Social History   Tobacco Use   Smoking status: Never   Smokeless tobacco: Former  Building services engineer Use: Never used  Substance Use Topics   Alcohol use: No   Drug use: No     Allergies   Patient has no known allergies.   Review of Systems Review of Systems  Constitutional:  Positive for activity change. Negative for appetite change, fatigue and fever.  Eyes:  Positive for photophobia. Negative for visual disturbance.  Respiratory:  Negative for cough and shortness of breath.   Cardiovascular:  Negative for chest pain.  Gastrointestinal:  Positive for nausea. Negative for abdominal pain, diarrhea and vomiting.  Musculoskeletal:  Positive for neck pain. Negative for arthralgias, back pain and myalgias.  Neurological:  Positive for headaches. Negative for dizziness, syncope, facial asymmetry, speech difficulty, weakness, light-headedness and numbness.     Physical Exam Triage Vital Signs ED Triage Vitals  Enc Vitals Group     BP 11/21/22 1053 138/89     Pulse Rate 11/21/22 1053 74     Resp --      Temp 11/21/22 1053 98.2 F (36.8 C)     Temp Source 11/21/22 1053 Oral     SpO2 11/21/22 1053 96 %     Weight 11/21/22 1052 290 lb (131.5 kg)     Height 11/21/22 1052  (1.854 m)     Head Circumference --      Peak Flow --      Pain Score 11/21/22 1051 8     Pain Loc --      Pain Edu? --      Excl. in GC? --    No data found.  Updated Vital Signs BP 138/89 (BP Location:  Left Arm)   Pulse 74   Temp 98.2 F (36.8 C) (Oral)   Ht  (1.854 m)   Wt 290 lb (131.5 kg)   SpO2 96%   BMI 38.26 kg/m   Visual Acuity Right Eye Distance:   Left Eye Distance:   Bilateral Distance:    Right Eye Near:   Left Eye Near:    Bilateral Near:     Physical Exam Vitals reviewed.  Constitutional:      General: He is awake.     Appearance: Normal appearance. He is well-developed. He is ill-appearing.     Comments: Very pleasant male appears stated age in no acute distress laying on gurney  in exam room with a wet washcloth over his eyes  HENT:     Head: Normocephalic and atraumatic.     Right Ear: External ear normal.     Left Ear: External ear normal.     Mouth/Throat:     Tongue: Tongue does not deviate from midline.     Pharynx: Uvula midline. No oropharyngeal exudate or posterior oropharyngeal erythema.  Eyes:     Extraocular Movements: Extraocular movements intact.     Conjunctiva/sclera: Conjunctivae normal.     Pupils: Pupils are equal, round, and reactive to light.  Cardiovascular:     Rate and Rhythm: Normal rate and regular rhythm.     Heart sounds: Normal heart sounds, S1 normal and S2 normal. No murmur heard. Pulmonary:     Effort: Pulmonary effort is normal. No accessory muscle usage or respiratory distress.     Breath sounds: Normal breath sounds. No stridor. No wheezing, rhonchi or rales.     Comments: Clear to auscultation bilaterally Abdominal:     Palpations: Abdomen is soft.     Tenderness: There is no abdominal tenderness.  Musculoskeletal:     Cervical back: Normal range of motion and neck supple. No spinous process tenderness or muscular tenderness.     Comments: Strength 5/5 bilateral upper and lower extremities  Neurological:     General: No focal deficit present.     Mental Status: He is alert and oriented to person, place, and time.     Cranial Nerves: Cranial nerves 2-12 are intact.     Motor: Motor function is intact.      Coordination: Coordination is intact. Romberg sign negative. Rapid alternating movements normal.     Gait: Gait is intact.     Comments: Cranial nerves II through XII grossly intact.  No focal neurological defect on exam.  Psychiatric:        Behavior: Behavior is cooperative.      UC Treatments / Results  Labs (all labs ordered are listed, but only abnormal results are displayed) Labs Reviewed - No data to display  EKG   Radiology No results found.  Procedures Procedures (including critical care time)  Medications Ordered in UC Medications  ketorolac (TORADOL) 30 MG/ML injection 15 mg (15 mg Intramuscular Given 11/21/22 1119)  dexamethasone (DECADRON) injection 10 mg (10 mg Intramuscular Given 11/21/22 1118)    Initial Impression / Assessment and Plan / UC Course  I have reviewed the triage vital signs and the nursing notes.  Pertinent labs & imaging results that were available during my care of the patient were reviewed by me and considered in my medical decision making (see chart for details).     Vital signs and physical exam are reassuring today with no indication for emergent evaluation or imaging.  We did discuss that if his symptoms or not improving he would need imaging and this is not something we can arrange in urgent care.  Patient was given dexamethasone as well as 15 mg of Toradol in clinic without significant improvement of symptoms.  He was encouraged to use baclofen to help manage his pain but we discussed that this is a similar medication to Flexeril and they should not be taken together.  Baclofen replaces Flexeril.  He was given 6 tablets of Fioricet and we discussed that this can also be sedating.  It should not be taken at the same time as baclofen and it is important that he does not drink alcohol with either of these  medications which she expressed understanding.  He is to avoid NSAIDs for the next 24 hours Toradol injection today.  Given severity of his  headache recommend that he follow-up with neurology and was given contact information for local provider with instruction to call to schedule an appointment.  Discussed he should follow-up with them as soon as possible and recommended he consider follow-up even if his symptoms resolve with medications.  We discussed that he should have a low threshold for going to the emergency room develops any worsening headache, worst headache of his life, nausea/vomiting interfering with oral intake, visual disturbance, dysarthria, focal weakness he needs to go to the emergency room immediately.  Strict return precautions given to which he and wife expressed understanding.  Work excuse note was provided.  Final Clinical Impressions(s) / UC Diagnoses   Final diagnoses:  Intractable chronic migraine without aura and with status migrainosus     Discharge Instructions      As we discussed, if anything changes or worsens and you have worsening headache, weakness, vision change, trouble finding your words, worst headache of your life you need to go to the emergency room immediately.  I would like you to follow-up with neurology.  Call them to schedule an appointment.  We gave you an injection of Toradol today.  Please do not take NSAIDs for the next 24 hours including aspirin, ibuprofen/Advil, naproxen/Aleve.  Use baclofen to help with your pain.  This replaces your Flexeril.  It will make you sleepy so do not drive or drink alcohol while taking it.  You can use Fioricet up to twice a day.  This can also make you sleepy so do not combine it with baclofen and make sure not to drive or drink alcohol taking it.  Make sure you rest and drink plenty of fluids.  If anything changes go to the ER.     ED Prescriptions     Medication Sig Dispense Auth. Provider   butalbital-acetaminophen-caffeine (FIORICET) 50-325-40 MG tablet Take 1 tablet by mouth 2 (two) times daily as needed for up to 3 days for headache. 6 tablet  Markeith Jue K, PA-C   baclofen (LIORESAL) 10 MG tablet Take 1 tablet (10 mg total) by mouth 2 (two) times daily as needed for muscle spasms. 14 each Inez Rosato K, PA-C      I have reviewed the PDMP during this encounter.   Jeani Hawking, PA-C 11/21/22 1234

## 2022-11-23 DIAGNOSIS — M9904 Segmental and somatic dysfunction of sacral region: Secondary | ICD-10-CM | POA: Diagnosis not present

## 2022-11-23 DIAGNOSIS — M9901 Segmental and somatic dysfunction of cervical region: Secondary | ICD-10-CM | POA: Diagnosis not present

## 2022-11-23 DIAGNOSIS — M9905 Segmental and somatic dysfunction of pelvic region: Secondary | ICD-10-CM | POA: Diagnosis not present

## 2022-11-23 DIAGNOSIS — M9903 Segmental and somatic dysfunction of lumbar region: Secondary | ICD-10-CM | POA: Diagnosis not present

## 2022-11-25 DIAGNOSIS — M9901 Segmental and somatic dysfunction of cervical region: Secondary | ICD-10-CM | POA: Diagnosis not present

## 2022-11-25 DIAGNOSIS — M9903 Segmental and somatic dysfunction of lumbar region: Secondary | ICD-10-CM | POA: Diagnosis not present

## 2022-11-25 DIAGNOSIS — M9905 Segmental and somatic dysfunction of pelvic region: Secondary | ICD-10-CM | POA: Diagnosis not present

## 2022-11-25 DIAGNOSIS — M9904 Segmental and somatic dysfunction of sacral region: Secondary | ICD-10-CM | POA: Diagnosis not present

## 2022-12-14 DIAGNOSIS — G4733 Obstructive sleep apnea (adult) (pediatric): Secondary | ICD-10-CM | POA: Diagnosis not present

## 2022-12-15 ENCOUNTER — Other Ambulatory Visit: Payer: Self-pay | Admitting: Student

## 2022-12-15 ENCOUNTER — Other Ambulatory Visit: Payer: Self-pay

## 2022-12-15 DIAGNOSIS — R519 Headache, unspecified: Secondary | ICD-10-CM | POA: Diagnosis not present

## 2022-12-15 DIAGNOSIS — F411 Generalized anxiety disorder: Secondary | ICD-10-CM | POA: Diagnosis not present

## 2022-12-15 DIAGNOSIS — R42 Dizziness and giddiness: Secondary | ICD-10-CM | POA: Diagnosis not present

## 2022-12-15 MED ORDER — METHYLPREDNISOLONE 4 MG PO TBPK
ORAL_TABLET | ORAL | 0 refills | Status: AC
  Filled 2022-12-15: qty 21, 6d supply, fill #0

## 2022-12-15 MED ORDER — TOPIRAMATE 25 MG PO TABS
ORAL_TABLET | ORAL | 11 refills | Status: DC
  Filled 2022-12-15: qty 60, 33d supply, fill #0
  Filled 2022-12-15: qty 60, 30d supply, fill #0
  Filled 2023-04-19: qty 60, 30d supply, fill #1

## 2022-12-15 MED ORDER — DIAZEPAM 2 MG PO TABS
2.0000 mg | ORAL_TABLET | ORAL | 0 refills | Status: DC
  Filled 2022-12-15: qty 2, 1d supply, fill #0

## 2022-12-22 ENCOUNTER — Other Ambulatory Visit: Payer: Self-pay

## 2022-12-22 ENCOUNTER — Ambulatory Visit
Admission: RE | Admit: 2022-12-22 | Discharge: 2022-12-22 | Disposition: A | Discharge status: Home / Self Care | Payer: PPO | Source: Ambulatory Visit | Attending: Student | Admitting: Student

## 2022-12-22 DIAGNOSIS — R519 Headache, unspecified: Secondary | ICD-10-CM | POA: Diagnosis not present

## 2022-12-22 MED ORDER — ONDANSETRON 4 MG PO TBDP
4.0000 mg | ORAL_TABLET | Freq: Two times a day (BID) | ORAL | 1 refills | Status: DC | PRN
  Filled 2022-12-22: qty 30, 15d supply, fill #0

## 2022-12-22 MED ORDER — OMEPRAZOLE 40 MG PO CPDR
40.0000 mg | DELAYED_RELEASE_CAPSULE | Freq: Two times a day (BID) | ORAL | 1 refills | Status: DC
  Filled 2022-12-22: qty 180, 90d supply, fill #0
  Filled 2023-03-21: qty 180, 90d supply, fill #1

## 2022-12-22 MED ORDER — GADOPICLENOL 0.5 MMOL/ML IV SOLN
10.0000 mL | Freq: Once | INTRAVENOUS | Status: AC | PRN
  Administered 2022-12-22: 10 mL via INTRAVENOUS

## 2022-12-22 MED ORDER — RIZATRIPTAN BENZOATE 5 MG PO TABS
5.0000 mg | ORAL_TABLET | Freq: Every day | ORAL | 0 refills | Status: DC | PRN
  Filled 2022-12-22: qty 10, 17d supply, fill #0

## 2022-12-23 ENCOUNTER — Other Ambulatory Visit: Payer: Self-pay

## 2022-12-28 ENCOUNTER — Other Ambulatory Visit: Payer: PPO

## 2022-12-28 DIAGNOSIS — R42 Dizziness and giddiness: Secondary | ICD-10-CM | POA: Diagnosis not present

## 2022-12-29 ENCOUNTER — Other Ambulatory Visit: Payer: Self-pay

## 2022-12-29 DIAGNOSIS — F331 Major depressive disorder, recurrent, moderate: Secondary | ICD-10-CM | POA: Diagnosis not present

## 2022-12-29 DIAGNOSIS — F5105 Insomnia due to other mental disorder: Secondary | ICD-10-CM | POA: Diagnosis not present

## 2022-12-29 DIAGNOSIS — F411 Generalized anxiety disorder: Secondary | ICD-10-CM | POA: Diagnosis not present

## 2022-12-29 DIAGNOSIS — F410 Panic disorder [episodic paroxysmal anxiety] without agoraphobia: Secondary | ICD-10-CM | POA: Diagnosis not present

## 2022-12-29 MED ORDER — AMPHETAMINE-DEXTROAMPHETAMINE 10 MG PO TABS
10.0000 mg | ORAL_TABLET | Freq: Two times a day (BID) | ORAL | 0 refills | Status: DC
  Filled 2022-12-29: qty 60, 30d supply, fill #0

## 2022-12-29 MED ORDER — LAMOTRIGINE 150 MG PO TABS
150.0000 mg | ORAL_TABLET | Freq: Every day | ORAL | 0 refills | Status: DC
  Filled 2022-12-29: qty 90, 90d supply, fill #0

## 2022-12-29 MED ORDER — TRAZODONE HCL 100 MG PO TABS
200.0000 mg | ORAL_TABLET | Freq: Every evening | ORAL | 0 refills | Status: DC | PRN
  Filled 2022-12-29: qty 180, 90d supply, fill #0

## 2023-01-11 ENCOUNTER — Ambulatory Visit: Payer: PPO | Admitting: Cardiovascular Disease

## 2023-01-13 ENCOUNTER — Other Ambulatory Visit: Payer: Self-pay

## 2023-01-13 DIAGNOSIS — R42 Dizziness and giddiness: Secondary | ICD-10-CM | POA: Diagnosis not present

## 2023-01-13 DIAGNOSIS — M5481 Occipital neuralgia: Secondary | ICD-10-CM | POA: Diagnosis not present

## 2023-01-13 DIAGNOSIS — F411 Generalized anxiety disorder: Secondary | ICD-10-CM | POA: Diagnosis not present

## 2023-01-13 DIAGNOSIS — R519 Headache, unspecified: Secondary | ICD-10-CM | POA: Diagnosis not present

## 2023-01-13 MED ORDER — DIAZEPAM 2 MG PO TABS
2.0000 mg | ORAL_TABLET | ORAL | 0 refills | Status: DC
  Filled 2023-01-13: qty 2, 1d supply, fill #0

## 2023-01-13 MED ORDER — EMGALITY 120 MG/ML ~~LOC~~ SOAJ
120.0000 mg | SUBCUTANEOUS | 3 refills | Status: DC
  Filled 2023-01-13: qty 1, 30d supply, fill #0

## 2023-01-14 ENCOUNTER — Other Ambulatory Visit: Payer: Self-pay

## 2023-01-14 DIAGNOSIS — F411 Generalized anxiety disorder: Secondary | ICD-10-CM | POA: Diagnosis not present

## 2023-01-14 DIAGNOSIS — F410 Panic disorder [episodic paroxysmal anxiety] without agoraphobia: Secondary | ICD-10-CM | POA: Diagnosis not present

## 2023-01-14 DIAGNOSIS — F331 Major depressive disorder, recurrent, moderate: Secondary | ICD-10-CM | POA: Diagnosis not present

## 2023-01-14 MED ORDER — HYDROXYZINE PAMOATE 50 MG PO CAPS
50.0000 mg | ORAL_CAPSULE | Freq: Two times a day (BID) | ORAL | 0 refills | Status: DC
  Filled 2023-01-14: qty 180, 90d supply, fill #0

## 2023-01-17 ENCOUNTER — Other Ambulatory Visit: Payer: Self-pay

## 2023-01-19 ENCOUNTER — Other Ambulatory Visit: Payer: Self-pay | Admitting: Student

## 2023-01-19 ENCOUNTER — Other Ambulatory Visit: Payer: Self-pay

## 2023-01-19 DIAGNOSIS — M542 Cervicalgia: Secondary | ICD-10-CM

## 2023-01-19 DIAGNOSIS — M5481 Occipital neuralgia: Secondary | ICD-10-CM

## 2023-01-27 ENCOUNTER — Other Ambulatory Visit: Payer: PPO

## 2023-01-27 ENCOUNTER — Other Ambulatory Visit: Payer: Self-pay

## 2023-01-27 DIAGNOSIS — F410 Panic disorder [episodic paroxysmal anxiety] without agoraphobia: Secondary | ICD-10-CM | POA: Diagnosis not present

## 2023-01-27 DIAGNOSIS — F331 Major depressive disorder, recurrent, moderate: Secondary | ICD-10-CM | POA: Diagnosis not present

## 2023-01-27 DIAGNOSIS — F411 Generalized anxiety disorder: Secondary | ICD-10-CM | POA: Diagnosis not present

## 2023-01-27 DIAGNOSIS — F5105 Insomnia due to other mental disorder: Secondary | ICD-10-CM | POA: Diagnosis not present

## 2023-01-27 MED ORDER — AMPHETAMINE-DEXTROAMPHETAMINE 10 MG PO TABS
15.0000 mg | ORAL_TABLET | Freq: Two times a day (BID) | ORAL | 0 refills | Status: DC
  Filled 2023-01-27: qty 90, 30d supply, fill #0

## 2023-01-31 ENCOUNTER — Ambulatory Visit
Admission: RE | Admit: 2023-01-31 | Discharge: 2023-01-31 | Disposition: A | Discharge status: Home / Self Care | Payer: PPO | Source: Ambulatory Visit | Attending: Student | Admitting: Student

## 2023-01-31 DIAGNOSIS — M542 Cervicalgia: Secondary | ICD-10-CM

## 2023-01-31 DIAGNOSIS — M5033 Other cervical disc degeneration, cervicothoracic region: Secondary | ICD-10-CM | POA: Diagnosis not present

## 2023-01-31 DIAGNOSIS — M50222 Other cervical disc displacement at C5-C6 level: Secondary | ICD-10-CM | POA: Diagnosis not present

## 2023-01-31 DIAGNOSIS — M5481 Occipital neuralgia: Secondary | ICD-10-CM

## 2023-02-03 DIAGNOSIS — G4733 Obstructive sleep apnea (adult) (pediatric): Secondary | ICD-10-CM | POA: Diagnosis not present

## 2023-02-07 ENCOUNTER — Other Ambulatory Visit: Payer: Self-pay

## 2023-02-07 DIAGNOSIS — E782 Mixed hyperlipidemia: Secondary | ICD-10-CM | POA: Diagnosis not present

## 2023-02-07 DIAGNOSIS — F3341 Major depressive disorder, recurrent, in partial remission: Secondary | ICD-10-CM | POA: Diagnosis not present

## 2023-02-07 DIAGNOSIS — J208 Acute bronchitis due to other specified organisms: Secondary | ICD-10-CM | POA: Diagnosis not present

## 2023-02-07 DIAGNOSIS — F411 Generalized anxiety disorder: Secondary | ICD-10-CM | POA: Diagnosis not present

## 2023-02-07 DIAGNOSIS — Z125 Encounter for screening for malignant neoplasm of prostate: Secondary | ICD-10-CM | POA: Diagnosis not present

## 2023-02-07 DIAGNOSIS — J209 Acute bronchitis, unspecified: Secondary | ICD-10-CM | POA: Diagnosis not present

## 2023-02-07 DIAGNOSIS — G4733 Obstructive sleep apnea (adult) (pediatric): Secondary | ICD-10-CM | POA: Diagnosis not present

## 2023-02-07 MED ORDER — LEVOFLOXACIN 500 MG PO TABS
500.0000 mg | ORAL_TABLET | Freq: Every day | ORAL | 0 refills | Status: DC
  Filled 2023-02-07: qty 7, 7d supply, fill #0

## 2023-02-07 MED ORDER — METHYLPREDNISOLONE 4 MG PO TBPK
ORAL_TABLET | ORAL | 0 refills | Status: DC
  Filled 2023-02-07: qty 21, 6d supply, fill #0

## 2023-02-07 MED ORDER — BENZONATATE 200 MG PO CAPS
200.0000 mg | ORAL_CAPSULE | Freq: Three times a day (TID) | ORAL | 0 refills | Status: DC
  Filled 2023-02-07: qty 20, 7d supply, fill #0

## 2023-02-07 MED ORDER — HYDROCOD POLI-CHLORPHE POLI ER 10-8 MG/5ML PO SUER
5.0000 mL | Freq: Two times a day (BID) | ORAL | 0 refills | Status: DC | PRN
  Filled 2023-02-07: qty 115, 12d supply, fill #0

## 2023-02-28 ENCOUNTER — Other Ambulatory Visit: Payer: Self-pay

## 2023-02-28 DIAGNOSIS — F331 Major depressive disorder, recurrent, moderate: Secondary | ICD-10-CM | POA: Diagnosis not present

## 2023-02-28 DIAGNOSIS — F410 Panic disorder [episodic paroxysmal anxiety] without agoraphobia: Secondary | ICD-10-CM | POA: Diagnosis not present

## 2023-02-28 DIAGNOSIS — F411 Generalized anxiety disorder: Secondary | ICD-10-CM | POA: Diagnosis not present

## 2023-02-28 DIAGNOSIS — F5105 Insomnia due to other mental disorder: Secondary | ICD-10-CM | POA: Diagnosis not present

## 2023-02-28 MED ORDER — HYDROXYZINE PAMOATE 50 MG PO CAPS
50.0000 mg | ORAL_CAPSULE | Freq: Two times a day (BID) | ORAL | 0 refills | Status: DC
  Filled 2023-02-28 – 2023-04-19 (×3): qty 180, 90d supply, fill #0

## 2023-02-28 MED ORDER — AMPHETAMINE-DEXTROAMPHETAMINE 20 MG PO TABS
20.0000 mg | ORAL_TABLET | Freq: Two times a day (BID) | ORAL | 0 refills | Status: DC
  Filled 2023-02-28: qty 180, 90d supply, fill #0

## 2023-02-28 MED ORDER — TRAZODONE HCL 100 MG PO TABS
200.0000 mg | ORAL_TABLET | Freq: Every evening | ORAL | 0 refills | Status: DC | PRN
  Filled 2023-02-28 – 2023-04-29 (×2): qty 180, 90d supply, fill #0

## 2023-02-28 MED ORDER — LAMOTRIGINE 150 MG PO TABS
150.0000 mg | ORAL_TABLET | Freq: Every day | ORAL | 0 refills | Status: DC
  Filled 2023-02-28 – 2023-04-19 (×3): qty 90, 90d supply, fill #0

## 2023-03-01 ENCOUNTER — Other Ambulatory Visit: Payer: Self-pay

## 2023-03-02 ENCOUNTER — Other Ambulatory Visit: Payer: Self-pay

## 2023-03-02 MED ORDER — METOPROLOL SUCCINATE ER 25 MG PO TB24
12.5000 mg | ORAL_TABLET | Freq: Every day | ORAL | 0 refills | Status: DC
  Filled 2023-03-02: qty 45, 90d supply, fill #0
  Filled 2023-06-04: qty 45, 90d supply, fill #1

## 2023-03-02 MED ORDER — ROSUVASTATIN CALCIUM 5 MG PO TABS
5.0000 mg | ORAL_TABLET | Freq: Every day | ORAL | 0 refills | Status: DC
  Filled 2023-03-02: qty 90, 90d supply, fill #0

## 2023-03-09 DIAGNOSIS — G4733 Obstructive sleep apnea (adult) (pediatric): Secondary | ICD-10-CM | POA: Diagnosis not present

## 2023-03-10 DIAGNOSIS — M542 Cervicalgia: Secondary | ICD-10-CM | POA: Diagnosis not present

## 2023-03-10 DIAGNOSIS — M5481 Occipital neuralgia: Secondary | ICD-10-CM | POA: Diagnosis not present

## 2023-03-10 DIAGNOSIS — R519 Headache, unspecified: Secondary | ICD-10-CM | POA: Diagnosis not present

## 2023-03-15 ENCOUNTER — Telehealth: Payer: Self-pay

## 2023-03-15 NOTE — Telephone Encounter (Signed)
New patient call placed to patient for confirmation on reason of appt with Dr. Mariah Milling on 03/25/23.  Previously seen by Dr. Mariah Milling in 2019 being referred for evaluation of intermittent chest pain with cardiac family history.    Directions to the office provided to patient.

## 2023-03-23 NOTE — Progress Notes (Signed)
Home cardiology Office Note  Date:  03/25/2023   ID:  Jonathan Aguirre, DOB [birth year], MRN [id number]  PCP:  Enid Baas, MD   Chief Complaint  Patient presents with   New Patient (Initial Visit)    Referred for cardiac evaluation of intermittent chest pain.  Was seen by Dr. Mariah Milling in 2019.    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 Wall thickness was   normal. Systolic function was normal. The estimated ejection   fraction was in the range of 50% to 55%. Wall motion was normal;   there were no regional wall motion abnormalities. Left   ventricular diastolic function parameters were normal. - Mitral valve: Mildly thickened leaflets . Systolic bowing without   prolapse. There was mild regurgitation. - Right ventricle: The cavity size was normal. Systolic function   was normal.    PMH:   has a past medical history of Anxiety and depression, Diverticulitis, GERD (gastroesophageal reflux disease), Hypertension, and Nephrolithiasis.  PSH:    Past Surgical History:  Procedure Laterality Date   COLON SURGERY     CYSTOSCOPY WITH STENT  PLACEMENT Left 06/28/2020   Procedure: CYSTOSCOPY WITH STENT PLACEMENT;  Surgeon: Bjorn Pippin, MD;  Location: [facility];  Service: Urology;  Laterality: Left;   CYSTOSCOPY/RETROGRADE/URETEROSCOPY/STONE EXTRACTION WITH BASKET  2016   CYSTOSCOPY/URETEROSCOPY/HOLMIUM LASER Left 06/28/2020   Procedure: CYSTOSCOPY/URETEROSCOPY;  Surgeon: Bjorn Pippin, MD;  Location: [facility];  Service: Urology;  Laterality: Left;   CYSTOSCOPY/URETEROSCOPY/HOLMIUM LASER/STENT PLACEMENT Left 07/07/2020   Procedure: CYSTOSCOPY/URETEROSCOPY/HOLMIUM LASER/STENT Exchange;  Surgeon: Vanna Scotland, MD;  Location: [facility];  Service: Urology;  Laterality: Left;    Current Outpatient Medications  Medication Sig Dispense Refill   amphetamine-dextroamphetamine (ADDERALL) 20 MG tablet Take 1 tablet (20 mg total) by mouth 2 (two) times daily at 8am and 1pm. 180 tablet 0   B Complex Vitamins (VITAMIN B COMPLEX) TABS Take 1 tablet by mouth daily.     Cholecalciferol (VITAMIN D-1000 MAX ST) 25 MCG (1000 UT) tablet Take 1,000 Units by mouth daily.     hydrOXYzine (VISTARIL) 50 MG capsule Take 1 capsule (50 mg total) by mouth 2 (two) times daily. 180 capsule 0   lamoTRIgine (LAMICTAL) 150 MG tablet Take 1 tablet (150 mg total) by mouth daily. 90 tablet 0   metoprolol succinate (TOPROL-XL) 25 MG 24 hr tablet Take 0.5 tablets (12.5 mg total) by mouth once daily 90 tablet 0   Multiple Vitamin (MULTIVITAMIN) tablet Take 1 tablet by mouth daily.     omeprazole (PRILOSEC) 40 MG capsule Take 1 capsule (40 mg total) by mouth 2 (two) times daily before a meal. 180 capsule 1  ondansetron (ZOFRAN-ODT) 4 MG disintegrating tablet Take 1 tablet (4 mg total) by mouth every 12 (twelve) hours as needed for nausea 30 tablet 1   rizatriptan (MAXALT) 5 MG tablet Take 1 tablet (5 mg total) by mouth once as needed for Migraine for up to 1 dose. May take a second dose after 2 hours if needed. 10 tablet 0   rosuvastatin (CRESTOR) 5 MG tablet Take 1 tablet (5  mg total) by mouth once daily 90 tablet 0   traZODone (DESYREL) 100 MG tablet Take 2 tablets (200 mg total) by mouth at bedtime as needed. 180 tablet 0   traZODone (DESYREL) 100 MG tablet Take 2 tablets (200 mg total) by mouth at bedtime as needed. 180 tablet 0   vitamin C (ASCORBIC ACID) 250 MG tablet Take 250 mg by mouth daily.     amphetamine-dextroamphetamine (ADDERALL) 10 MG tablet Take 1.5 tablets (15 mg total) by mouth 2 (two) times daily. (Patient not taking: Reported on 03/25/2023) 90 tablet 0   baclofen (LIORESAL) 10 MG tablet Take 1 tablet (10 mg total) by mouth 2 (two) times daily as needed for muscle spasms. (Patient not taking: Reported on 03/25/2023) 14 each 0   benzonatate (TESSALON) 200 MG capsule Take 1 capsule (200 mg total) by mouth 3 (three) times daily for 7 days. (Patient not taking: Reported on 03/25/2023) 20 capsule 0   chlorpheniramine-HYDROcodone (TUSSIONEX) 10-8 MG/5ML Take 5 mLs by mouth every 12 (twelve) hours as needed. (Patient not taking: Reported on 03/25/2023) 115 mL 0   hydrOXYzine (ATARAX/VISTARIL) 50 MG tablet Take 50 mg by mouth every 8 (eight) hours as needed for anxiety. (Patient not taking: Reported on 03/25/2023)     lamoTRIgine (LAMICTAL) 100 MG tablet Take 100 mg by mouth daily. (Patient not taking: Reported on 03/25/2023)     levofloxacin (LEVAQUIN) 500 MG tablet Take 1 tablet (500 mg total) by mouth daily for 7 days. (Patient not taking: Reported on 03/25/2023) 7 tablet 0   methylPREDNISolone (MEDROL DOSEPAK) 4 MG TBPK tablet Follow package directions. (Patient not taking: Reported on 03/25/2023) 21 tablet 0   promethazine (PHENERGAN) 25 MG suppository Place 1 suppository (25 mg total) rectally every 6 (six) hours as needed for nausea or vomiting. (Patient not taking: Reported on 03/25/2023) 12 each 0   topiramate (TOPAMAX) 25 MG tablet Take 1 tablet (25 mg total) by mouth Nightly for 7 days, THEN 1 tablet (25 mg total) 2 (two) times daily and continue this dose.  (Patient not taking: Reported on 03/25/2023) 60 tablet 11   No current facility-administered medications for this visit.    Allergies:   Patient has no known allergies.   Social History:  The patient  reports that he has never smoked. He has quit using smokeless tobacco. He reports that he does not drink alcohol and does not use drugs.   Family History:   family history includes Heart attack in his cousin, maternal grandfather, maternal uncle, and maternal uncle; Heart failure in his mother; Hyperlipidemia in his mother; Hypertension in his mother.    Review of Systems: Review of Systems  Constitutional: Negative.   HENT: Negative.    Respiratory: Negative.    Cardiovascular:  Positive for chest pain.  Gastrointestinal: Negative.   Musculoskeletal: Negative.   Neurological: Negative.   Psychiatric/Behavioral: Negative.    All other systems reviewed and are negative.    PHYSICAL EXAM: VS:  BP 112/88 (BP Location: Left Arm, Patient Position: Sitting, Cuff Size: Large)  Pulse 75   Ht 6\' 1"  (1.854 m)   Wt 256 lb 0.5 oz (116.1 kg)   SpO2 96%   BMI 33.78 kg/m  , BMI Body mass index is 33.78 kg/m. GEN: Well nourished, well developed, in no acute distress HEENT: normal Neck: no JVD, carotid bruits, or masses Cardiac: RRR; no murmurs, rubs, or gallops,no edema  Respiratory:  clear to auscultation bilaterally, normal work of breathing GI: soft, nontender, nondistended, + BS MS: no deformity or atrophy Skin: warm and dry, no rash Neuro:  Strength and sensation are intact Psych: euthymic mood, full affect  Recent Labs: No results found for requested labs within last 365 days.    Lipid Panel Lab Results  Component Value Date   CHOL 138 03/01/2017   HDL 37 (L) 03/01/2017   LDLCALC 77 03/01/2017   TRIG 121 03/01/2017      Wt Readings from Last 3 Encounters:  03/25/23 256 lb 0.5 oz (116.1 kg)  11/21/22 290 lb (131.5 kg)  02/16/22 295 lb (133.8 kg)     ASSESSMENT AND  PLAN:  Problem List Items Addressed This Visit       Cardiology Problems   Hypertension - Primary     Other   Shortness of breath   Chest pain   Relevant Orders   EKG 12-Lead (Completed)   Morbid obesity (HCC)   Anxiety   Dizziness   Chest pain/angina Reports having stuttering chest pain over the past several months Episode of chest burning in May 2024 Also episodes of shortness of breath Strong family history of coronary disease Various options discussed with him for ischemic workup given anginal symptoms Cardiac CTA ordered for further evaluation  Obesity Weight trending downward over the past year Appears weight down 40 pounds in the past year Active on the farm  Hyperlipidemia Cholesterol numbers well-controlled on Crestor 5  Essential hypertension Blood pressure is well controlled on today's visit. No changes made to the medications.    Total encounter time more than 50 minutes  Greater than 50% was spent in counseling and coordination of care with the patient    Signed, Dossie Arbour, M.D., Ph.D. [facility] [place], [place] [phone]

## 2023-03-25 ENCOUNTER — Other Ambulatory Visit: Payer: Self-pay

## 2023-03-25 ENCOUNTER — Ambulatory Visit: Payer: PPO | Admitting: Cardiovascular Disease

## 2023-03-25 ENCOUNTER — Encounter: Payer: Self-pay | Admitting: Cardiovascular Disease

## 2023-03-25 VITALS — BP 112/88 | HR 75 | Ht 73.0 in | Wt 256.0 lb

## 2023-03-25 DIAGNOSIS — I10 Essential (primary) hypertension: Secondary | ICD-10-CM | POA: Diagnosis not present

## 2023-03-25 DIAGNOSIS — R0602 Shortness of breath: Secondary | ICD-10-CM

## 2023-03-25 DIAGNOSIS — I209 Angina pectoris, unspecified: Secondary | ICD-10-CM | POA: Diagnosis not present

## 2023-03-25 DIAGNOSIS — R42 Dizziness and giddiness: Secondary | ICD-10-CM | POA: Diagnosis not present

## 2023-03-25 DIAGNOSIS — Z79899 Other long term (current) drug therapy: Secondary | ICD-10-CM

## 2023-03-25 DIAGNOSIS — R079 Chest pain, unspecified: Secondary | ICD-10-CM | POA: Diagnosis not present

## 2023-03-25 DIAGNOSIS — F419 Anxiety disorder, unspecified: Secondary | ICD-10-CM

## 2023-03-25 MED ORDER — METOPROLOL TARTRATE 100 MG PO TABS
100.0000 mg | ORAL_TABLET | Freq: Once | ORAL | 0 refills | Status: DC
  Filled 2023-03-25: qty 1, 1d supply, fill #0

## 2023-03-25 NOTE — Patient Instructions (Addendum)
Medication Instructions:  No changes  If you need a refill on your cardiac medications before your next appointment, please call your pharmacy.   Lab work: Your provider would like for you to have following labs drawn today BMP.    Testing/Procedures: Cardiac CTA for angina   Your cardiac CT will be scheduled at one of the below locations:   [facility] [address] [place], [place] [address] [phone]  OR   [facility] [address] [place], [place] [address] [phone]    If scheduled at [facility] or [facility], please arrive 15 mins early for check-in and test prep.  There is spacious parking and easy access to the radiology department from the [facility] entrance. Please enter here and check-in with the desk attendant.   Please follow these instructions carefully (unless otherwise directed):  An IV will be required for this test and Nitroglycerin will be given.  On the Night Before the Test: Be sure to Drink plenty of water. Do not consume any caffeinated/decaffeinated beverages or chocolate 12 hours prior to your test. Do not take any antihistamines 12 hours prior to your test. On the Day of the Test: Drink plenty of water until 1 hour prior to the test. Do not eat any food 1 hour prior to test. You may take your regular medications prior to the test.  Take metoprolol (Lopressor) 100 mg two hours prior to test. If you take Furosemide/Hydrochlorothiazide/Spironolactone, please HOLD on the morning of the test.      After the Test: Drink plenty of water. After receiving IV contrast, you may experience a mild flushed feeling. This is normal. On occasion, you may experience a mild rash up to 24 hours after the test. This is not dangerous. If this occurs, you can take Benadryl 25 mg and increase your fluid intake. If you  experience trouble breathing, this can be serious. If it is severe call 911 IMMEDIATELY. If it is mild, please call our office. If you take any of these medications: Glipizide/Metformin, Avandament, Glucavance, please do not take 48 hours after completing test unless otherwise instructed.  We will call to schedule your test 2-4 weeks out understanding that some insurance companies will need an authorization prior to the service being performed.   For more information and frequently asked questions, please visit our website : [email or web address]  For non-scheduling related questions, please contact the cardiac imaging nurse navigator should you have any questions/concerns: Cardiac Imaging Nurse Navigators Direct Office Dial: [phone]   For scheduling needs, including cancellations and rescheduling, please call [place], [phone].     Follow-Up: At [facility], you and your health needs are our priority.  As part of our continuing mission to provide you with exceptional heart care, we have created designated Provider Care Teams.  These Care Teams include your primary Cardiologist (physician) and Advanced Practice Providers (APPs -  Physician Assistants and Nurse Practitioners) who all work together to provide you with the care you need, when you need it.  You will need a follow up appointment in 12 months  Providers on your designated Care Team:   Nicolasa Ducking, NP Eula Listen, PA-C Cadence Fransico Michael, [place]  COVID-19 Vaccine Information can be found at: [email or web address] For questions related to vaccine distribution or appointments, please email [email or web address] or call [phone].

## 2023-03-29 ENCOUNTER — Other Ambulatory Visit: Payer: Self-pay | Admitting: Emergency Medicine

## 2023-03-29 DIAGNOSIS — R072 Precordial pain: Secondary | ICD-10-CM

## 2023-04-04 DIAGNOSIS — Z79899 Other long term (current) drug therapy: Secondary | ICD-10-CM | POA: Diagnosis not present

## 2023-04-05 LAB — BASIC METABOLIC PANEL
BUN/Creatinine Ratio: 13 (ref 9–20)
BUN: 13 mg/dL (ref 6–24)
CO2: 25 mmol/L (ref 20–29)
Calcium: 9.5 mg/dL (ref 8.7–10.2)
Chloride: 104 mmol/L (ref 96–106)
Creatinine, Ser: 1.04 mg/dL (ref 0.76–1.27)
Glucose: 84 mg/dL (ref 70–99)
Potassium: 4.6 mmol/L (ref 3.5–5.2)
Sodium: 148 mmol/L — ABNORMAL HIGH (ref 134–144)
eGFR: 85 mL/min/{1.73_m2} (ref >59)

## 2023-04-06 ENCOUNTER — Telehealth (HOSPITAL_COMMUNITY): Payer: Self-pay | Admitting: *Deleted

## 2023-04-06 NOTE — Telephone Encounter (Signed)
Reaching out to patient to offer assistance regarding upcoming cardiac imaging study; pt verbalizes understanding of appt date/time, parking situation and where to check in, pre-test NPO status and medications ordered, and verified current allergies; name and call back number provided for further questions should they arise  Larey Brick RN Navigator Cardiac Imaging [facility] [phone] office [phone] cell  Patient to hold his adderall until after the test. He is to take 100mg  metoprolol tartrate two hours prior to his cardiac CT scan.

## 2023-04-07 ENCOUNTER — Ambulatory Visit
Admission: RE | Admit: 2023-04-07 | Discharge: 2023-04-07 | Disposition: A | Discharge status: Home / Self Care | Payer: PPO | Source: Ambulatory Visit | Attending: Cardiovascular Disease | Admitting: Cardiovascular Disease

## 2023-04-07 DIAGNOSIS — R072 Precordial pain: Secondary | ICD-10-CM | POA: Diagnosis not present

## 2023-04-07 MED ORDER — IOHEXOL 350 MG/ML SOLN
100.0000 mL | Freq: Once | INTRAVENOUS | Status: AC | PRN
  Administered 2023-04-07: 100 mL via INTRAVENOUS

## 2023-04-07 MED ORDER — NITROGLYCERIN 0.4 MG SL SUBL
0.8000 mg | SUBLINGUAL_TABLET | Freq: Once | SUBLINGUAL | Status: AC
  Administered 2023-04-07: 0.8 mg via SUBLINGUAL

## 2023-04-07 MED ORDER — SODIUM CHLORIDE 0.9 % IV SOLN: INTRAVENOUS | Status: DC

## 2023-04-07 NOTE — Progress Notes (Signed)
Patient tolerated procedure well. Ambulate w/o difficulty. Denies light headedness or being dizzy. Sitting in chair drinking water provided. Encouraged to drink extra water today and reasoning explained. Verbalized understanding. All questions answered. ABC intact. No further needs. Discharge from procedure area w/o issues.   °

## 2023-04-09 DIAGNOSIS — G4733 Obstructive sleep apnea (adult) (pediatric): Secondary | ICD-10-CM | POA: Diagnosis not present

## 2023-04-19 ENCOUNTER — Other Ambulatory Visit: Payer: Self-pay

## 2023-04-20 ENCOUNTER — Other Ambulatory Visit: Payer: Self-pay

## 2023-04-21 ENCOUNTER — Other Ambulatory Visit: Payer: Self-pay

## 2023-04-21 MED ORDER — RIZATRIPTAN BENZOATE 5 MG PO TABS
5.0000 mg | ORAL_TABLET | ORAL | 0 refills | Status: DC
  Filled 2023-04-21: qty 10, 30d supply, fill #0

## 2023-04-29 ENCOUNTER — Other Ambulatory Visit: Payer: Self-pay

## 2023-05-04 DIAGNOSIS — G4733 Obstructive sleep apnea (adult) (pediatric): Secondary | ICD-10-CM | POA: Diagnosis not present

## 2023-05-09 ENCOUNTER — Ambulatory Visit: Payer: PPO

## 2023-05-09 DIAGNOSIS — G4733 Obstructive sleep apnea (adult) (pediatric): Secondary | ICD-10-CM | POA: Diagnosis not present

## 2023-05-16 ENCOUNTER — Ambulatory Visit: Payer: PPO | Attending: Internal Medicine

## 2023-05-16 DIAGNOSIS — M25551 Pain in right hip: Secondary | ICD-10-CM | POA: Insufficient documentation

## 2023-05-16 DIAGNOSIS — M25651 Stiffness of right hip, not elsewhere classified: Secondary | ICD-10-CM | POA: Diagnosis not present
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
## 2023-05-20 ENCOUNTER — Ambulatory Visit: Payer: PPO

## 2023-05-23 ENCOUNTER — Ambulatory Visit: Payer: PPO

## 2023-05-23 DIAGNOSIS — M25551 Pain in right hip: Secondary | ICD-10-CM | POA: Diagnosis not present

## 2023-05-23 DIAGNOSIS — M25651 Stiffness of right hip, not elsewhere classified: Secondary | ICD-10-CM
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
## 2023-05-25 ENCOUNTER — Ambulatory Visit: Payer: PPO

## 2023-05-25 DIAGNOSIS — M25651 Stiffness of right hip, not elsewhere classified: Secondary | ICD-10-CM

## 2023-05-25 DIAGNOSIS — M25551 Pain in right hip: Secondary | ICD-10-CM
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
## 2023-05-26 ENCOUNTER — Other Ambulatory Visit: Payer: Self-pay

## 2023-05-26 DIAGNOSIS — F5105 Insomnia due to other mental disorder: Secondary | ICD-10-CM | POA: Diagnosis not present

## 2023-05-26 DIAGNOSIS — F411 Generalized anxiety disorder: Secondary | ICD-10-CM | POA: Diagnosis not present

## 2023-05-26 DIAGNOSIS — F331 Major depressive disorder, recurrent, moderate: Secondary | ICD-10-CM | POA: Diagnosis not present

## 2023-05-26 DIAGNOSIS — F410 Panic disorder [episodic paroxysmal anxiety] without agoraphobia: Secondary | ICD-10-CM | POA: Diagnosis not present

## 2023-05-26 MED ORDER — HYDROXYZINE PAMOATE 50 MG PO CAPS
50.0000 mg | ORAL_CAPSULE | Freq: Two times a day (BID) | ORAL | 0 refills | Status: DC
  Filled 2023-05-26: qty 180, 90d supply, fill #0

## 2023-05-26 MED ORDER — LAMOTRIGINE 150 MG PO TABS
ORAL_TABLET | Freq: Every day | ORAL | 0 refills | Status: DC
  Filled 2023-05-26: qty 90, 90d supply, fill #0

## 2023-05-26 MED ORDER — TRAZODONE HCL 100 MG PO TABS
200.0000 mg | ORAL_TABLET | Freq: Every evening | ORAL | 0 refills | Status: DC | PRN
  Filled 2023-05-26 – 2023-08-09 (×2): qty 180, 90d supply, fill #0

## 2023-05-26 MED ORDER — AMPHETAMINE-DEXTROAMPHETAMINE 20 MG PO TABS
20.0000 mg | ORAL_TABLET | Freq: Two times a day (BID) | ORAL | 0 refills | Status: DC
  Filled 2023-05-26 – 2023-06-04 (×3): qty 180, 90d supply, fill #0

## 2023-05-27 ENCOUNTER — Other Ambulatory Visit: Payer: Self-pay

## 2023-05-30 ENCOUNTER — Ambulatory Visit: Payer: PPO

## 2023-05-30 DIAGNOSIS — M25551 Pain in right hip: Secondary | ICD-10-CM

## 2023-05-30 DIAGNOSIS — M25651 Stiffness of right hip, not elsewhere classified: Secondary | ICD-10-CM

## 2023-06-01 ENCOUNTER — Ambulatory Visit: Payer: PPO
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
## 2023-06-04 ENCOUNTER — Other Ambulatory Visit: Payer: Self-pay

## 2023-06-05 ENCOUNTER — Other Ambulatory Visit: Payer: Self-pay

## 2023-06-06 ENCOUNTER — Ambulatory Visit: Payer: PPO

## 2023-06-06 ENCOUNTER — Other Ambulatory Visit: Payer: Self-pay

## 2023-06-06 MED ORDER — ROSUVASTATIN CALCIUM 5 MG PO TABS
5.0000 mg | ORAL_TABLET | Freq: Every day | ORAL | 0 refills | Status: DC
  Filled 2023-06-06: qty 90, 90d supply, fill #0

## 2023-06-08 ENCOUNTER — Ambulatory Visit: Payer: PPO

## 2023-06-09 DIAGNOSIS — G4733 Obstructive sleep apnea (adult) (pediatric): Secondary | ICD-10-CM | POA: Diagnosis not present

## 2023-06-13 ENCOUNTER — Ambulatory Visit: Payer: PPO

## 2023-06-13 DIAGNOSIS — G8929 Other chronic pain: Secondary | ICD-10-CM | POA: Diagnosis not present

## 2023-06-13 DIAGNOSIS — M7061 Trochanteric bursitis, right hip: Secondary | ICD-10-CM | POA: Diagnosis not present

## 2023-06-13 DIAGNOSIS — Z125 Encounter for screening for malignant neoplasm of prostate: Secondary | ICD-10-CM | POA: Diagnosis not present

## 2023-06-13 DIAGNOSIS — E782 Mixed hyperlipidemia: Secondary | ICD-10-CM | POA: Diagnosis not present

## 2023-06-15 ENCOUNTER — Ambulatory Visit: Payer: PPO

## 2023-06-22 DIAGNOSIS — E782 Mixed hyperlipidemia: Secondary | ICD-10-CM | POA: Diagnosis not present

## 2023-06-22 DIAGNOSIS — E875 Hyperkalemia: Secondary | ICD-10-CM | POA: Diagnosis not present

## 2023-06-22 DIAGNOSIS — F411 Generalized anxiety disorder: Secondary | ICD-10-CM | POA: Diagnosis not present

## 2023-06-22 DIAGNOSIS — G4733 Obstructive sleep apnea (adult) (pediatric): Secondary | ICD-10-CM | POA: Diagnosis not present

## 2023-06-22 DIAGNOSIS — Z0000 Encounter for general adult medical examination without abnormal findings: Secondary | ICD-10-CM | POA: Diagnosis not present

## 2023-06-23 ENCOUNTER — Other Ambulatory Visit: Payer: Self-pay

## 2023-06-23 MED ORDER — OMEPRAZOLE 40 MG PO CPDR
DELAYED_RELEASE_CAPSULE | ORAL | 1 refills | Status: DC
  Filled 2023-06-23: qty 180, 90d supply, fill #0

## 2023-07-06 ENCOUNTER — Other Ambulatory Visit: Payer: Self-pay

## 2023-07-06 ENCOUNTER — Emergency Department
Admission: EM | Admit: 2023-07-06 | Discharge: 2023-07-06 | Disposition: A | Discharge status: Home / Self Care | Payer: PPO | Attending: Emergency Medicine | Admitting: Emergency Medicine

## 2023-07-06 ENCOUNTER — Telehealth: Payer: Self-pay

## 2023-07-06 ENCOUNTER — Emergency Department: Payer: PPO

## 2023-07-06 DIAGNOSIS — K529 Noninfective gastroenteritis and colitis, unspecified: Secondary | ICD-10-CM | POA: Diagnosis not present

## 2023-07-06 DIAGNOSIS — I10 Essential (primary) hypertension: Secondary | ICD-10-CM | POA: Insufficient documentation

## 2023-07-06 DIAGNOSIS — K4090 Unilateral inguinal hernia, without obstruction or gangrene, not specified as recurrent: Secondary | ICD-10-CM | POA: Diagnosis not present

## 2023-07-06 DIAGNOSIS — R197 Diarrhea, unspecified: Secondary | ICD-10-CM

## 2023-07-06 DIAGNOSIS — N200 Calculus of kidney: Secondary | ICD-10-CM | POA: Diagnosis not present

## 2023-07-06 DIAGNOSIS — N400 Enlarged prostate without lower urinary tract symptoms: Secondary | ICD-10-CM | POA: Diagnosis not present

## 2023-07-06 DIAGNOSIS — R1013 Epigastric pain: Secondary | ICD-10-CM | POA: Diagnosis present

## 2023-07-06 LAB — COMPREHENSIVE METABOLIC PANEL
ALT: 18 U/L (ref 0–44)
AST: 16 U/L (ref 15–41)
Albumin: 4 g/dL (ref 3.5–5.0)
Alkaline Phosphatase: 46 U/L (ref 38–126)
Anion gap: 7 (ref 5–15)
BUN: 12 mg/dL (ref 6–20)
CO2: 28 mmol/L (ref 22–32)
Calcium: 9 mg/dL (ref 8.9–10.3)
Chloride: 102 mmol/L (ref 98–111)
Creatinine, Ser: 0.94 mg/dL (ref 0.61–1.24)
GFR, Estimated: >60 mL/min (ref >60)
Glucose, Bld: 100 mg/dL — ABNORMAL HIGH (ref 70–99)
Potassium: 3.5 mmol/L (ref 3.5–5.1)
Sodium: 137 mmol/L (ref 135–145)
Total Bilirubin: 0.9 mg/dL (ref <1.2)
Total Protein: 6.8 g/dL (ref 6.5–8.1)

## 2023-07-06 LAB — CBC
HCT: 45.5 % (ref 39.0–52.0)
Hemoglobin: 15.6 g/dL (ref 13.0–17.0)
MCH: 29.1 pg (ref 26.0–34.0)
MCHC: 34.3 g/dL (ref 30.0–36.0)
MCV: 84.9 fL (ref 80.0–100.0)
Platelets: 279 10*3/uL (ref 150–400)
RBC: 5.36 MIL/uL (ref 4.22–5.81)
RDW: 12.9 % (ref 11.5–15.5)
WBC: 7 10*3/uL (ref 4.0–10.5)
nRBC: 0 % (ref 0.0–0.2)

## 2023-07-06 LAB — GASTROINTESTINAL PANEL BY PCR, STOOL (REPLACES STOOL CULTURE)
Adenovirus F40/41: NOT DETECTED
Astrovirus: NOT DETECTED
Campylobacter species: DETECTED — AB
Cryptosporidium: NOT DETECTED
Cyclospora cayetanensis: NOT DETECTED
E. coli O157: DETECTED — AB
Entamoeba histolytica: NOT DETECTED
Enteroaggregative E coli (EAEC): NOT DETECTED
Enterotoxigenic E coli (ETEC): DETECTED — AB
Giardia lamblia: NOT DETECTED
Norovirus GI/GII: NOT DETECTED
Plesimonas shigelloides: NOT DETECTED
Rotavirus A: NOT DETECTED
Salmonella species: NOT DETECTED
Sapovirus (I, II, IV, and V): NOT DETECTED
Shiga like toxin producing E coli (STEC): DETECTED — AB
Shigella/Enteroinvasive E coli (EIEC): NOT DETECTED
Vibrio cholerae: NOT DETECTED
Vibrio species: NOT DETECTED
Yersinia enterocolitica: NOT DETECTED

## 2023-07-06 LAB — C DIFFICILE QUICK SCREEN W PCR REFLEX
C Diff antigen: NEGATIVE
C Diff interpretation: NOT DETECTED
C Diff toxin: NEGATIVE

## 2023-07-06 LAB — URINALYSIS, ROUTINE W REFLEX MICROSCOPIC
Bilirubin Urine: NEGATIVE
Glucose, UA: NEGATIVE mg/dL
Hgb urine dipstick: NEGATIVE
Ketones, ur: 5 mg/dL — AB
Leukocytes,Ua: NEGATIVE
Nitrite: NEGATIVE
Protein, ur: NEGATIVE mg/dL
Specific Gravity, Urine: 1.023 (ref 1.005–1.030)
pH: 5 (ref 5.0–8.0)

## 2023-07-06 LAB — LIPASE, BLOOD: Lipase: 37 U/L (ref 11–51)

## 2023-07-06 MED ORDER — METOCLOPRAMIDE HCL 5 MG/ML IJ SOLN
10.0000 mg | Freq: Once | INTRAMUSCULAR | Status: AC
  Administered 2023-07-06: 10 mg via INTRAVENOUS
  Filled 2023-07-06: qty 2

## 2023-07-06 MED ORDER — CIPROFLOXACIN HCL 500 MG PO TABS: 500.0000 mg | ORAL_TABLET | Freq: Two times a day (BID) | ORAL | 0 refills | Status: AC

## 2023-07-06 MED ORDER — MORPHINE SULFATE (PF) 4 MG/ML IV SOLN
4.0000 mg | Freq: Once | INTRAVENOUS | Status: AC
  Administered 2023-07-06: 4 mg via INTRAVENOUS
  Filled 2023-07-06: qty 1

## 2023-07-06 MED ORDER — DICYCLOMINE HCL 10 MG PO CAPS
10.0000 mg | ORAL_CAPSULE | Freq: Once | ORAL | Status: AC
  Administered 2023-07-06: 10 mg via ORAL
  Filled 2023-07-06: qty 1

## 2023-07-06 MED ORDER — SODIUM CHLORIDE 0.9 % IV BOLUS
1000.0000 mL | Freq: Once | INTRAVENOUS | Status: AC
  Administered 2023-07-06: 1000 mL via INTRAVENOUS

## 2023-07-06 MED ORDER — DIPHENOXYLATE-ATROPINE 2.5-0.025 MG PO TABS
2.0000 | ORAL_TABLET | Freq: Once | ORAL | Status: AC
  Administered 2023-07-06: 2 via ORAL
  Filled 2023-07-06: qty 2

## 2023-07-06 MED ORDER — ONDANSETRON 4 MG PO TBDP
4.0000 mg | ORAL_TABLET | Freq: Three times a day (TID) | ORAL | 0 refills | Status: DC | PRN
  Filled 2023-07-06: qty 20, 7d supply, fill #0

## 2023-07-06 MED ORDER — IOHEXOL 300 MG/ML  SOLN
100.0000 mL | Freq: Once | INTRAMUSCULAR | Status: AC | PRN
  Administered 2023-07-06: 100 mL via INTRAVENOUS

## 2023-07-06 MED ORDER — METRONIDAZOLE 500 MG PO TABS
500.0000 mg | ORAL_TABLET | Freq: Three times a day (TID) | ORAL | 0 refills | Status: DC
  Filled 2023-07-06: qty 21, 7d supply, fill #0

## 2023-07-06 MED ORDER — PANTOPRAZOLE SODIUM 40 MG PO TBEC: 40.0000 mg | DELAYED_RELEASE_TABLET | Freq: Every day | ORAL | 1 refills | Status: DC

## 2023-07-06 MED ORDER — METOCLOPRAMIDE HCL 10 MG PO TABS: 10.0000 mg | ORAL_TABLET | Freq: Three times a day (TID) | ORAL | 0 refills | Status: DC

## 2023-07-06 MED ORDER — ONDANSETRON 4 MG PO TBDP
ORAL_TABLET | ORAL | Status: AC
  Filled 2023-07-06: qty 1

## 2023-07-06 MED ORDER — ONDANSETRON 4 MG PO TBDP
4.0000 mg | ORAL_TABLET | Freq: Once | ORAL | Status: AC | PRN
  Administered 2023-07-06: 4 mg via ORAL

## 2023-07-06 MED ORDER — DIPHENOXYLATE-ATROPINE 2.5-0.025 MG PO TABS: 1.0000 | ORAL_TABLET | Freq: Four times a day (QID) | ORAL | 0 refills | Status: AC | PRN

## 2023-07-06 MED ORDER — METRONIDAZOLE 500 MG PO TABS: 500.0000 mg | ORAL_TABLET | Freq: Three times a day (TID) | ORAL | 0 refills | Status: AC

## 2023-07-06 MED ORDER — PANTOPRAZOLE SODIUM 40 MG PO TBEC
40.0000 mg | DELAYED_RELEASE_TABLET | Freq: Every day | ORAL | 1 refills | Status: DC
  Filled 2023-07-06: qty 30, 30d supply, fill #0

## 2023-07-06 MED ORDER — DICYCLOMINE HCL 10 MG PO CAPS: 10.0000 mg | ORAL_CAPSULE | Freq: Three times a day (TID) | ORAL | 0 refills | Status: AC

## 2023-07-06 MED ORDER — PANTOPRAZOLE SODIUM 40 MG IV SOLR
40.0000 mg | Freq: Once | INTRAVENOUS | Status: AC
  Administered 2023-07-06: 40 mg via INTRAVENOUS
  Filled 2023-07-06: qty 10

## 2023-07-06 MED ORDER — DICYCLOMINE HCL 10 MG PO CAPS
10.0000 mg | ORAL_CAPSULE | Freq: Three times a day (TID) | ORAL | 0 refills | Status: DC
  Filled 2023-07-06: qty 30, 8d supply, fill #0

## 2023-07-06 MED ORDER — CIPROFLOXACIN HCL 500 MG PO TABS
500.0000 mg | ORAL_TABLET | Freq: Two times a day (BID) | ORAL | 0 refills | Status: DC
  Filled 2023-07-06: qty 14, 7d supply, fill #0

## 2023-07-06 MED ORDER — HYDROCODONE-ACETAMINOPHEN 5-325 MG PO TABS: 1.0000 | ORAL_TABLET | Freq: Three times a day (TID) | ORAL | 0 refills | Status: AC | PRN

## 2023-07-06 NOTE — ED Notes (Signed)
See triage note  Presents with some abd pain  with n/v/d  States sxs's started last Friday  Afebrile on arrival   Family at bedside

## 2023-07-06 NOTE — ED Provider Notes (Signed)
----------------------------------------- 3:46 PM on 07/06/2023 -----------------------------------------  Blood pressure (!) 142/105, pulse 92, temperature 98 F (36.7 C), temperature source Oral, resp. rate 18, height 6\' 1"  (1.854 m), weight 102.1 kg, SpO2 100%.  Assuming care from Dr. Greig Right, PA-C/NP-C.  In short, Jonathan Aguirre is a 52 y.o. male with a chief complaint of Abdominal Pain, Emesis, and Diarrhea .  Refer to the original H&P for additional details.  The current plan of care is to await interim evaluation following fluid bolus and initial IV medication administration.  Patient likely stable for discharge at home as long as he feels comfortable with outpatient management of his symptoms..  ____________________________________________    ED Results / Procedures / Treatments   Labs (all labs ordered are listed, but only abnormal results are displayed) Labs Reviewed  COMPREHENSIVE METABOLIC PANEL - Abnormal; Notable for the following components:      Result Value   Glucose, Bld 100 (*)    All other components within normal limits  URINALYSIS, ROUTINE W REFLEX MICROSCOPIC - Abnormal; Notable for the following components:   Color, Urine AMBER (*)    APPearance CLEAR (*)    Ketones, ur 5 (*)    All other components within normal limits  GASTROINTESTINAL PANEL BY PCR, STOOL (REPLACES STOOL CULTURE)  C DIFFICILE QUICK SCREEN W PCR REFLEX    LIPASE, BLOOD  CBC     EKG    RADIOLOGY  I personally viewed and evaluated these images as part of my medical decision making, as well as reviewing the written report by the radiologist.  ED Provider Interpretation: Mild sigmoid colitis noted}  CT ABDOMEN PELVIS W CONTRAST  Result Date: 07/06/2023 CLINICAL DATA:  Mid abdominal pain, nausea, vomiting, and diarrhea EXAM: CT ABDOMEN AND PELVIS WITH CONTRAST TECHNIQUE: Multidetector CT imaging of the abdomen and pelvis was performed using the standard protocol following  bolus administration of intravenous contrast. RADIATION DOSE REDUCTION: This exam was performed according to the departmental dose-optimization program which includes automated exposure control, adjustment of the mA and/or kV according to patient size and/or use of iterative reconstruction technique. CONTRAST:  OMNIPAQUE IOHEXOL 300 MG/ML  SOLN COMPARISON:  CT abdomen and pelvis dated 03/04/2022 FINDINGS: Lower chest: No focal consolidation or pulmonary nodule in the lung bases. No pleural effusion or pneumothorax demonstrated. Partially imaged heart size is normal. Hepatobiliary: Subcentimeter segment 6 hypodensity (2:31), too small to characterize, but appears new hypoattenuation along the falciform ligament may reflect perfusional variation or focal steatosis. no intra or extrahepatic biliary ductal dilation. Normal gallbladder. Pancreas: No focal lesions or main ductal dilation. Spleen: Normal in size without focal abnormality. Adrenals/Urinary Tract: No adrenal nodules. Bilateral subcentimeter hypodensities, too small to characterize but most likely simple/minimally complicated cysts. No specific follow-up imaging recommended. Subjective hypoenhancement of the anterior lower pole right kidney (2:42). 3 mm left renal lower pole stone. No hydronephrosis. Small diverticulum arising from the left lateral bladder. No mural thickening. Stomach/Bowel: Normal appearance of the stomach. Postsurgical changes of hemicolectomy. Anastomosis is patent. Mild mucosal hyperenhancement of the rectosigmoid colon. No mural thickening or abnormal bowel dilation. No evidence of bowel wall thickening, distention, or inflammatory changes. Normal appendix. Vascular/Lymphatic: No significant vascular findings are present. No enlarged abdominal or pelvic lymph nodes. Reproductive: Enlargement of the prostate with median lobe hypertrophy. Other: No free fluid, fluid collection, or free air. Scattered surgical clips in the right  upper quadrant mesentery. Musculoskeletal: No acute or abnormal lytic or blastic osseous lesions. Small fat-containing left  inguinal hernia. IMPRESSION: 1. Subjective hypoenhancement of the anterior lower pole right kidney, which may be artifactual or reflect infection/inflammation. Consider nonemergent follow-up renal protocol CT or MRI abdomen for further evaluation and to ensure resolution. 2. Mild mucosal hyperenhancement of the rectosigmoid colon, which may reflect mild colitis. No mural thickening or abnormal bowel dilation. 3. Nonobstructing 3 mm left renal lower pole stone. 4. Enlargement of the prostate with median lobe hypertrophy. 5. Small fat-containing left inguinal hernia. Electronically Signed   By: Agustin Cree M.D.   On: 07/06/2023 14:31     PROCEDURES:  Critical Care performed: No  Procedures   MEDICATIONS ORDERED IN ED: Medications  ondansetron (ZOFRAN-ODT) disintegrating tablet 4 mg ( Oral Canceled Entry 07/06/23 1917)  sodium chloride 0.9 % bolus 1,000 mL (1,000 mLs Intravenous Bolus from Bag 07/06/23 1225)  iohexol (OMNIPAQUE) 300 MG/ML solution 100 mL (100 mLs Intravenous Contrast Given 07/06/23 1236)  metoCLOPramide (REGLAN) injection 10 mg (10 mg Intravenous Given 07/06/23 1505)  morphine (PF) 4 MG/ML injection 4 mg (4 mg Intravenous Given 07/06/23 1505)  sodium chloride 0.9 % bolus 1,000 mL (1,000 mLs Intravenous New Bag/Given 07/06/23 1505)  pantoprazole (PROTONIX) injection 40 mg (40 mg Intravenous Given 07/06/23 1550)  dicyclomine (BENTYL) capsule 10 mg (10 mg Oral Given 07/06/23 1550)  morphine (PF) 4 MG/ML injection 4 mg (4 mg Intravenous Given 07/06/23 1826)  diphenoxylate-atropine (LOMOTIL) 2.5-0.025 MG per tablet 2 tablet (2 tablets Oral Given 07/06/23 1835)     IMPRESSION / MDM / ASSESSMENT AND PLAN / ED COURSE  I reviewed the triage vital signs and the nursing notes.                              Differential diagnosis includes, but is not limited to,  acute appendicitis, renal colic, testicular torsion, urinary tract infection/pyelonephritis, prostatitis,  epididymitis, diverticulitis, small bowel obstruction or ileus, colitis, abdominal aortic aneurysm, gastroenteritis, hernia, etc.   Patient's presentation is most consistent with acute complicated illness / injury requiring diagnostic workup.  Patient's diagnosis is consistent with colitis, of presumed infectious etiology.  Patient presents to the ED for evaluation management of intermittent nausea, vomiting, and diarrhea for the last week.  He will report about 2 days of of no diarrhea as he has had no significant bowel movement secondary to poor p.o. intake.  Labs overall reassuring and CT scan does confirm some mild sigmoid colitis.  Patient with fluid bolus and pain medicine provided in the ED.  An initial dose of Lomotil also provided at this time.  Stool cultures are pending as patient had recurrence of his diarrhea during his ED course.  Patient will be discharged home with prescriptions for Flagyl, Cipro, Lomotil, Norco, Reglan, Protonix.  Patient is advised on a brat diet with increase intake of electrolyte water to help reduce dehydration.  Patient is to follow up with PCP as discussed, as needed or otherwise directed. Patient is given ED precautions to return to the ED for any worsening or new symptoms.   FINAL CLINICAL IMPRESSION(S) / ED DIAGNOSES   Final diagnoses:  Acute colitis  Nausea vomiting and diarrhea     Rx / DC Orders   ED Discharge Orders          Ordered    ciprofloxacin (CIPRO) 500 MG tablet  2 times daily        07/06/23 1538    metroNIDAZOLE (FLAGYL) 500 MG tablet  3 times  daily        07/06/23 1538    dicyclomine (BENTYL) 10 MG capsule  3 times daily before meals & bedtime        07/06/23 1538    pantoprazole (PROTONIX) 40 MG tablet  Daily        07/06/23 1538    ondansetron (ZOFRAN-ODT) 4 MG disintegrating tablet  Every 8 hours PRN        07/06/23 1540     diphenoxylate-atropine (LOMOTIL) 2.5-0.025 MG tablet  4 times daily PRN        07/06/23 1926    metoCLOPramide (REGLAN) 10 MG tablet  3 times daily with meals        07/06/23 1926    HYDROcodone-acetaminophen (NORCO) 5-325 MG tablet  3 times daily PRN        07/06/23 1926             Note:  This document was prepared using Dragon voice recognition software and may include unintentional dictation errors.    Lissa Hoard, PA-C 07/06/23 Serena Croissant    Janith Lima, MD 07/09/23 [phone]

## 2023-07-06 NOTE — ED Triage Notes (Signed)
Pt c/o mid abdominal pain, nausea, vomiting, and diarrhea since this past Friday. Denies urinary symptoms. Pt AxOx4.

## 2023-07-06 NOTE — Discharge Instructions (Signed)
Take the the prescription meds as directed.  Continue to hydrate using Gatorade/Powerade/propel vitamin water to help maintain electrolytes.  As you advance your diet, increase your intake of carb rich foods like potatoes, rice, corn bread dressing, and toast.  Follow-up with your primary provider or return to the ED if necessary.

## 2023-07-06 NOTE — ED Provider Notes (Signed)
[facility] Provider Note    Event Date/Time   First MD Initiated Contact with Patient 07/06/23 1144     (approximate)   History   Abdominal Pain, Emesis, and Diarrhea   HPI  Jonathan Aguirre is a 52 y.o. male with history of hypertension, diverticulitis, GERD, nephrolithiasis presents emergency department with complaints of epigastric pain, fever, vomiting, diarrhea.  Last episode of vomiting was over the weekend.  Patient had last episode of diarrhea 2 days ago.  States he still unable to eat and drink.  Does have a history of a bowel obstruction.  Patient denies bloody emesis, bloody diarrhea, or dark stools       Physical Exam   Triage Vital Signs: ED Triage Vitals  Encounter Vitals Group     BP 07/06/23 1125 (!) 142/105     Systolic BP Percentile --      Diastolic BP Percentile --      Pulse Rate 07/06/23 1125 92     Resp 07/06/23 1125 18     Temp 07/06/23 1125 98 F (36.7 C)     Temp Source 07/06/23 1125 Oral     SpO2 07/06/23 1125 100 %     Weight 07/06/23 1124 225 lb (102.1 kg)     Height 07/06/23 1124 6\' 1"  (1.854 m)     Head Circumference --      Peak Flow --      Pain Score 07/06/23 1124 7     Pain Loc --      Pain Education --      Exclude from Growth Chart --     Most recent vital signs: Vitals:   07/06/23 1125  BP: (!) 142/105  Pulse: 92  Resp: 18  Temp: 98 F (36.7 C)  SpO2: 100%     General: Awake, no distress.   CV:  Good peripheral perfusion. regular rate and  rhythm Resp:  Normal effort. Lungs CTA Abd:  No distention.  Tender in the epigastric area Other:      ED Results / Procedures / Treatments   Labs (all labs ordered are listed, but only abnormal results are displayed) Labs Reviewed  COMPREHENSIVE METABOLIC PANEL - Abnormal; Notable for the following components:      Result Value   Glucose, Bld 100 (*)    All other components within normal limits  URINALYSIS, ROUTINE W REFLEX MICROSCOPIC -  Abnormal; Notable for the following components:   Color, Urine AMBER (*)    APPearance CLEAR (*)    Ketones, ur 5 (*)    All other components within normal limits  LIPASE, BLOOD  CBC     EKG     RADIOLOGY CT abdomen pelvis IV contrast    PROCEDURES:   Procedures   MEDICATIONS ORDERED IN ED: Medications  pantoprazole (PROTONIX) injection 40 mg (has no administration in time range)  dicyclomine (BENTYL) capsule 10 mg (has no administration in time range)  ondansetron (ZOFRAN-ODT) disintegrating tablet 4 mg (4 mg Oral Given 07/06/23 1132)  sodium chloride 0.9 % bolus 1,000 mL (1,000 mLs Intravenous Bolus from Bag 07/06/23 1225)  iohexol (OMNIPAQUE) 300 MG/ML solution 100 mL (100 mLs Intravenous Contrast Given 07/06/23 1236)  metoCLOPramide (REGLAN) injection 10 mg (10 mg Intravenous Given 07/06/23 1505)  morphine (PF) 4 MG/ML injection 4 mg (4 mg Intravenous Given 07/06/23 1505)  sodium chloride 0.9 % bolus 1,000 mL (1,000 mLs Intravenous New Bag/Given 07/06/23 1505)     IMPRESSION / MDM /  ASSESSMENT AND PLAN / ED COURSE  I reviewed the triage vital signs and the nursing notes.                              Differential diagnosis includes, but is not limited to, SBO, pancreatitis, acute cholecystitis, peptic ulcer, diverticulitis  Patient's presentation is most consistent with acute illness / injury with system symptoms.   Patient's labs are reassuring, lipase is normal so pancreatitis less likely.  Patient was given Zofran and ODT in triage, will give him normal saline 1 L IV  CT abdomen pelvis IV contrast   CT abdomen with IV contrast, did independently review interpret radiologist readings being positive for colitis.  I did explain this to the patient.  He continues to feel nauseated and is having abdominal pain.  He does appear to be a little sweaty.  Will obtain an EKG, I did order additional nausea medication Reglan 10 mg IV, and Protonix IV.  Patient is  getting a second bag of normal saline.  EKG independently reviewed interpreted by me as being negative for STEMI, sinus rhythm noted, no acute changes, see physician read  Patient is feeling a little better after the Reglan and fluids are still continuing.  Will give him a dose of Protonix as he feels a burning sensation in the abdomen and Bentyl for abdominal cramping.  We did discuss admission for his symptoms.  Shared decision making feel that he is able to be discharged.  Patient states he would rather go home if feasible.  Feel that we can try outpatient treatment.  I did send a prescription for Cipro, Flagyl, Protonix, Bentyl, and Zofran to his pharmacy.  He is to follow-up with his regular doctor if not improving in 2 days.  Return emergency department if worsening.  In agreement treatment plan.  Care is being transferred to [facility], PA-C; plan is to assess after medications and discharge if better.   FINAL CLINICAL IMPRESSION(S) / ED DIAGNOSES   Final diagnoses:  Acute colitis     Rx / DC Orders   ED Discharge Orders          Ordered    ciprofloxacin (CIPRO) 500 MG tablet  2 times daily        07/06/23 1538    metroNIDAZOLE (FLAGYL) 500 MG tablet  3 times daily        07/06/23 1538    dicyclomine (BENTYL) 10 MG capsule  3 times daily before meals & bedtime        07/06/23 1538    pantoprazole (PROTONIX) 40 MG tablet  Daily        07/06/23 1538    ondansetron (ZOFRAN-ODT) 4 MG disintegrating tablet  Every 8 hours PRN        07/06/23 1540             Note:  This document was prepared using Dragon voice recognition software and may include unintentional dictation errors.    Faythe Ghee, PA-C 07/06/23 Isac Sarna    Jene Every, MD 07/08/23 [phone]

## 2023-07-06 NOTE — Telephone Encounter (Signed)
CRITICAL VALUE STICKER  CRITICAL VALUE: GI Pathogen Panel Positive for Campylobacter species, Enterotoxigenic E coli, Shiga like toxin producing E coli, E. coli O157.  RECEIVER (on-site recipient of call): Pamala Duffel, RN   DATE & TIME NOTIFIED: 07/06/2023 - 2114  MD NOTIFIED: Dr. Claudell Kyle, and Dr. Shaune Pollack   TIME OF NOTIFICATION: 2132  RESPONSE: Awaiting response

## 2023-07-09 DIAGNOSIS — G4733 Obstructive sleep apnea (adult) (pediatric): Secondary | ICD-10-CM | POA: Diagnosis not present

## 2023-07-27 LAB — MISCELLANEOUS TEST

## 2023-08-09 ENCOUNTER — Other Ambulatory Visit: Payer: Self-pay

## 2023-08-09 DIAGNOSIS — G4733 Obstructive sleep apnea (adult) (pediatric): Secondary | ICD-10-CM | POA: Diagnosis not present

## 2023-08-09 MED ORDER — CYCLOBENZAPRINE HCL 10 MG PO TABS
10.0000 mg | ORAL_TABLET | Freq: Two times a day (BID) | ORAL | 2 refills | Status: AC | PRN
  Filled 2023-08-09: qty 180, 90d supply, fill #0
  Filled 2024-03-08: qty 180, 90d supply, fill #1

## 2023-08-16 ENCOUNTER — Other Ambulatory Visit: Payer: Self-pay

## 2023-08-16 MED ORDER — RIZATRIPTAN BENZOATE 5 MG PO TABS
5.0000 mg | ORAL_TABLET | Freq: Once | ORAL | 0 refills | Status: DC | PRN
  Filled 2023-08-16: qty 10, 30d supply, fill #0

## 2023-08-24 ENCOUNTER — Other Ambulatory Visit: Payer: Self-pay

## 2023-08-24 DIAGNOSIS — F410 Panic disorder [episodic paroxysmal anxiety] without agoraphobia: Secondary | ICD-10-CM | POA: Diagnosis not present

## 2023-08-24 DIAGNOSIS — F331 Major depressive disorder, recurrent, moderate: Secondary | ICD-10-CM | POA: Diagnosis not present

## 2023-08-24 DIAGNOSIS — F411 Generalized anxiety disorder: Secondary | ICD-10-CM | POA: Diagnosis not present

## 2023-08-24 DIAGNOSIS — F5105 Insomnia due to other mental disorder: Secondary | ICD-10-CM | POA: Diagnosis not present

## 2023-08-24 MED ORDER — AMPHETAMINE-DEXTROAMPHET ER 30 MG PO CP24
60.0000 mg | ORAL_CAPSULE | Freq: Every day | ORAL | 0 refills | Status: DC
  Filled 2023-08-24: qty 60, 30d supply, fill #0

## 2023-08-24 MED ORDER — LAMOTRIGINE 150 MG PO TABS
150.0000 mg | ORAL_TABLET | Freq: Every day | ORAL | 0 refills | Status: DC
  Filled 2023-08-24: qty 90, 90d supply, fill #0

## 2023-08-24 MED ORDER — HYDROXYZINE PAMOATE 50 MG PO CAPS
50.0000 mg | ORAL_CAPSULE | Freq: Two times a day (BID) | ORAL | 0 refills | Status: DC
  Filled 2023-08-24: qty 180, 90d supply, fill #0

## 2023-08-24 MED ORDER — TRAZODONE HCL 100 MG PO TABS
200.0000 mg | ORAL_TABLET | Freq: Every evening | ORAL | 0 refills | Status: DC | PRN
  Filled 2023-08-24: qty 180, 90d supply, fill #0

## 2023-08-30 ENCOUNTER — Other Ambulatory Visit: Payer: Self-pay

## 2023-08-30 MED ORDER — AMPHETAMINE-DEXTROAMPHETAMINE 20 MG PO TABS
ORAL_TABLET | ORAL | 0 refills | Status: DC
  Filled 2023-09-05: qty 225, 90d supply, fill #0

## 2023-09-05 ENCOUNTER — Other Ambulatory Visit: Payer: Self-pay

## 2023-09-06 ENCOUNTER — Other Ambulatory Visit: Payer: Self-pay

## 2023-09-07 ENCOUNTER — Other Ambulatory Visit: Payer: Self-pay

## 2023-09-07 MED ORDER — METOPROLOL SUCCINATE ER 25 MG PO TB24
12.5000 mg | ORAL_TABLET | Freq: Every day | ORAL | 0 refills | Status: DC
  Filled 2023-09-07: qty 45, 90d supply, fill #0
  Filled 2023-12-08: qty 45, 90d supply, fill #1

## 2023-09-07 MED ORDER — ROSUVASTATIN CALCIUM 5 MG PO TABS
5.0000 mg | ORAL_TABLET | Freq: Every day | ORAL | 0 refills | Status: DC
  Filled 2023-09-07: qty 90, 90d supply, fill #0

## 2023-09-09 DIAGNOSIS — G4733 Obstructive sleep apnea (adult) (pediatric): Secondary | ICD-10-CM | POA: Diagnosis not present

## 2023-10-03 ENCOUNTER — Other Ambulatory Visit: Payer: Self-pay

## 2023-10-03 MED ORDER — OMEPRAZOLE 40 MG PO CPDR
40.0000 mg | DELAYED_RELEASE_CAPSULE | Freq: Two times a day (BID) | ORAL | 1 refills | Status: DC
  Filled 2023-10-03: qty 180, 90d supply, fill #0
  Filled 2024-01-03: qty 180, 90d supply, fill #1

## 2023-10-07 DIAGNOSIS — G4733 Obstructive sleep apnea (adult) (pediatric): Secondary | ICD-10-CM | POA: Diagnosis not present

## 2023-10-12 ENCOUNTER — Other Ambulatory Visit: Payer: Self-pay

## 2023-10-12 MED ORDER — RIZATRIPTAN BENZOATE 5 MG PO TABS
5.0000 mg | ORAL_TABLET | ORAL | 0 refills | Status: AC | PRN
  Filled 2023-10-12: qty 10, 20d supply, fill #0

## 2023-10-21 ENCOUNTER — Other Ambulatory Visit: Payer: Self-pay

## 2023-10-21 MED ORDER — TRAZODONE HCL 100 MG PO TABS
200.0000 mg | ORAL_TABLET | Freq: Every evening | ORAL | 0 refills | Status: DC | PRN
  Filled 2023-10-21: qty 180, 90d supply, fill #0

## 2023-10-21 MED ORDER — LAMOTRIGINE 150 MG PO TABS
150.0000 mg | ORAL_TABLET | Freq: Every day | ORAL | 0 refills | Status: DC
Start: 2023-10-21 — End: 2024-01-16
  Filled 2023-10-21: qty 90, 90d supply, fill #0

## 2023-10-21 MED ORDER — HYDROXYZINE PAMOATE 50 MG PO CAPS
50.0000 mg | ORAL_CAPSULE | Freq: Two times a day (BID) | ORAL | 0 refills | Status: DC
  Filled 2023-10-21: qty 180, 90d supply, fill #0

## 2023-11-07 DIAGNOSIS — G4733 Obstructive sleep apnea (adult) (pediatric): Secondary | ICD-10-CM | POA: Diagnosis not present

## 2023-11-14 ENCOUNTER — Other Ambulatory Visit: Payer: Self-pay

## 2023-11-14 DIAGNOSIS — G8929 Other chronic pain: Secondary | ICD-10-CM | POA: Diagnosis not present

## 2023-11-14 DIAGNOSIS — M25562 Pain in left knee: Secondary | ICD-10-CM | POA: Diagnosis not present

## 2023-11-14 DIAGNOSIS — S8991XA Unspecified injury of right lower leg, initial encounter: Secondary | ICD-10-CM | POA: Diagnosis not present

## 2023-11-14 DIAGNOSIS — M170 Bilateral primary osteoarthritis of knee: Secondary | ICD-10-CM | POA: Diagnosis not present

## 2023-11-14 DIAGNOSIS — M25561 Pain in right knee: Secondary | ICD-10-CM | POA: Diagnosis not present

## 2023-11-14 DIAGNOSIS — M2391 Unspecified internal derangement of right knee: Secondary | ICD-10-CM | POA: Diagnosis not present

## 2023-11-14 MED ORDER — DICLOFENAC SODIUM 75 MG PO TBEC
75.0000 mg | DELAYED_RELEASE_TABLET | Freq: Two times a day (BID) | ORAL | 0 refills | Status: DC
  Filled 2023-11-14: qty 60, 30d supply, fill #0

## 2023-11-15 ENCOUNTER — Other Ambulatory Visit: Payer: Self-pay | Admitting: Sports Medicine

## 2023-11-15 DIAGNOSIS — S8991XA Unspecified injury of right lower leg, initial encounter: Secondary | ICD-10-CM

## 2023-11-15 DIAGNOSIS — M2391 Unspecified internal derangement of right knee: Secondary | ICD-10-CM

## 2023-11-18 ENCOUNTER — Ambulatory Visit
Admission: RE | Admit: 2023-11-18 | Discharge: 2023-11-18 | Disposition: A | Discharge status: Home / Self Care | Source: Ambulatory Visit | Attending: Sports Medicine | Admitting: Sports Medicine

## 2023-11-18 DIAGNOSIS — M2391 Unspecified internal derangement of right knee: Secondary | ICD-10-CM

## 2023-11-18 DIAGNOSIS — M25561 Pain in right knee: Secondary | ICD-10-CM | POA: Diagnosis not present

## 2023-11-18 DIAGNOSIS — S8991XA Unspecified injury of right lower leg, initial encounter: Secondary | ICD-10-CM

## 2023-12-07 DIAGNOSIS — G4733 Obstructive sleep apnea (adult) (pediatric): Secondary | ICD-10-CM | POA: Diagnosis not present

## 2023-12-08 ENCOUNTER — Other Ambulatory Visit: Payer: Self-pay

## 2023-12-08 MED ORDER — METOPROLOL SUCCINATE ER 25 MG PO TB24
12.5000 mg | ORAL_TABLET | Freq: Every day | ORAL | 0 refills | Status: DC
  Filled 2023-12-08 – 2024-02-27 (×2): qty 45, 90d supply, fill #0
  Filled 2024-06-18: qty 45, 90d supply, fill #1

## 2023-12-08 MED ORDER — ROSUVASTATIN CALCIUM 5 MG PO TABS
5.0000 mg | ORAL_TABLET | Freq: Every day | ORAL | 0 refills | Status: DC
  Filled 2023-12-08: qty 90, 90d supply, fill #0

## 2023-12-12 DIAGNOSIS — M25461 Effusion, right knee: Secondary | ICD-10-CM | POA: Diagnosis not present

## 2023-12-12 DIAGNOSIS — S83241D Other tear of medial meniscus, current injury, right knee, subsequent encounter: Secondary | ICD-10-CM | POA: Diagnosis not present

## 2023-12-12 DIAGNOSIS — M1711 Unilateral primary osteoarthritis, right knee: Secondary | ICD-10-CM | POA: Diagnosis not present

## 2023-12-12 DIAGNOSIS — E875 Hyperkalemia: Secondary | ICD-10-CM | POA: Diagnosis not present

## 2023-12-12 DIAGNOSIS — G8929 Other chronic pain: Secondary | ICD-10-CM | POA: Diagnosis not present

## 2023-12-16 ENCOUNTER — Other Ambulatory Visit: Payer: Self-pay

## 2023-12-19 ENCOUNTER — Other Ambulatory Visit: Payer: Self-pay

## 2023-12-19 MED ORDER — AMPHETAMINE-DEXTROAMPHETAMINE 20 MG PO TABS
ORAL_TABLET | ORAL | 0 refills | Status: DC
  Filled 2023-12-19: qty 75, 30d supply, fill #0

## 2023-12-20 ENCOUNTER — Other Ambulatory Visit: Payer: Self-pay

## 2023-12-20 DIAGNOSIS — M23206 Derangement of unspecified meniscus due to old tear or injury, right knee: Secondary | ICD-10-CM | POA: Diagnosis not present

## 2023-12-20 DIAGNOSIS — F3341 Major depressive disorder, recurrent, in partial remission: Secondary | ICD-10-CM | POA: Diagnosis not present

## 2023-12-20 DIAGNOSIS — M25559 Pain in unspecified hip: Secondary | ICD-10-CM | POA: Diagnosis not present

## 2023-12-20 DIAGNOSIS — R252 Cramp and spasm: Secondary | ICD-10-CM | POA: Diagnosis not present

## 2023-12-20 DIAGNOSIS — E782 Mixed hyperlipidemia: Secondary | ICD-10-CM | POA: Diagnosis not present

## 2023-12-20 DIAGNOSIS — F411 Generalized anxiety disorder: Secondary | ICD-10-CM | POA: Diagnosis not present

## 2023-12-20 MED ORDER — GABAPENTIN 300 MG PO CAPS
300.0000 mg | ORAL_CAPSULE | Freq: Every evening | ORAL | 2 refills | Status: AC | PRN
  Filled 2023-12-20: qty 30, 30d supply, fill #0

## 2024-01-05 ENCOUNTER — Encounter: Payer: Self-pay | Admitting: Orthopedic Surgery

## 2024-01-05 ENCOUNTER — Other Ambulatory Visit: Payer: Self-pay | Admitting: Orthopedic Surgery

## 2024-01-05 DIAGNOSIS — G8929 Other chronic pain: Secondary | ICD-10-CM | POA: Diagnosis not present

## 2024-01-05 DIAGNOSIS — M25561 Pain in right knee: Secondary | ICD-10-CM | POA: Diagnosis not present

## 2024-01-05 NOTE — Anesthesia Preprocedure Evaluation (Addendum)
 Anesthesia Evaluation  Patient identified by MRN, date of birth, ID band Patient awake    Reviewed: Allergy & Precautions, H&P , NPO status , Patient's Chart, lab work & pertinent test results  Airway Mallampati: III  TM Distance: >3 FB Neck ROM: Full    Dental no notable dental hx.  Permanent bridge left lower jaw:   Pulmonary neg pulmonary ROS, shortness of breath, sleep apnea    Pulmonary exam normal breath sounds clear to auscultation       Cardiovascular hypertension, negative cardio ROS Normal cardiovascular exam Rhythm:Regular Rate:Normal  03-01-17 echo History:   PMH:   Dyspnea.  Angina pectoris.  Risk factors:  Dizziness. Family history of coronary artery disease. Lifelong  nonsmoker. Hypertension. Obese.   -------------------------------------------------------------------  Study Conclusions   - Left ventricle: The cavity size was normal. Wall thickness was    normal. Systolic function was normal. The estimated ejection    fraction was in the range of 50% to 55%. Wall motion was normal;    there were no regional wall motion abnormalities. Left    ventricular diastolic function parameters were normal.  - Mitral valve: Mildly thickened leaflets . Systolic bowing without    prolapse. There was mild regurgitation.  - Right ventricle: The cavity size was normal. Systolic function    was normal.     Neuro/Psych  Headaches PSYCHIATRIC DISORDERS Anxiety Depression    negative neurological ROS  negative psych ROS   GI/Hepatic negative GI ROS, Neg liver ROS,GERD  ,,  Endo/Other  negative endocrine ROS    Renal/GU Renal diseasenegative Renal ROS  negative genitourinary   Musculoskeletal negative musculoskeletal ROS (+) Arthritis ,    Abdominal   Peds negative pediatric ROS (+)  Hematology negative hematology ROS (+)   Anesthesia Other Findings Hypertension  GERD (gastroesophageal reflux  disease) Diverticulitis  Nephrolithiasis Anxiety and depression  History of kidney stones Sleep apnea  Wears contact lenses Arthritis  Migraine headache Mild mitral regurgitation by prior echocardiogram  Generalized anxiety disorder Agoraphobia with panic disorder     Reproductive/Obstetrics negative OB ROS                              Anesthesia Physical Anesthesia Plan  ASA: 2  Anesthesia Plan: General ETT   Post-op Pain Management:    Induction: Intravenous, Rapid sequence and Cricoid pressure planned  PONV Risk Score and Plan:   Airway Management Planned: Oral ETT  Additional Equipment:   Intra-op Plan:   Post-operative Plan: Extubation in OR  Informed Consent: I have reviewed the patients History and Physical, chart, labs and discussed the procedure including the risks, benefits and alternatives for the proposed anesthesia with the patient or authorized representative who has indicated his/her understanding and acceptance.     Dental Advisory Given  Plan Discussed with: Anesthesiologist, CRNA and Surgeon  Anesthesia Plan Comments: (Patient consented for risks of anesthesia including but not limited to:  - adverse reactions to medications - damage to eyes, teeth, lips or other oral mucosa - nerve damage due to positioning  - sore throat or hoarseness - Damage to heart, brain, nerves, lungs, other parts of body or loss of life  Patient voiced understanding and assent.)         Anesthesia Quick Evaluation

## 2024-01-08 DIAGNOSIS — G4733 Obstructive sleep apnea (adult) (pediatric): Secondary | ICD-10-CM | POA: Diagnosis not present

## 2024-01-13 ENCOUNTER — Other Ambulatory Visit: Payer: Self-pay

## 2024-01-13 ENCOUNTER — Encounter
Admission: RE | Disposition: A | Discharge status: Home / Self Care | Payer: Self-pay | Source: Home / Self Care | Attending: Orthopedic Surgery

## 2024-01-13 ENCOUNTER — Ambulatory Visit
Admission: RE | Admit: 2024-01-13 | Discharge: 2024-01-13 | Disposition: A | Discharge status: Home / Self Care | Attending: Orthopedic Surgery | Admitting: Orthopedic Surgery

## 2024-01-13 ENCOUNTER — Ambulatory Visit: Payer: Self-pay | Admitting: Anesthesiology

## 2024-01-13 ENCOUNTER — Encounter: Payer: Self-pay | Admitting: Orthopedic Surgery

## 2024-01-13 DIAGNOSIS — X58XXXA Exposure to other specified factors, initial encounter: Secondary | ICD-10-CM | POA: Diagnosis not present

## 2024-01-13 DIAGNOSIS — S83242A Other tear of medial meniscus, current injury, left knee, initial encounter: Secondary | ICD-10-CM | POA: Diagnosis not present

## 2024-01-13 DIAGNOSIS — S83241A Other tear of medial meniscus, current injury, right knee, initial encounter: Secondary | ICD-10-CM | POA: Diagnosis not present

## 2024-01-13 DIAGNOSIS — K219 Gastro-esophageal reflux disease without esophagitis: Secondary | ICD-10-CM | POA: Insufficient documentation

## 2024-01-13 DIAGNOSIS — M1712 Unilateral primary osteoarthritis, left knee: Secondary | ICD-10-CM | POA: Diagnosis not present

## 2024-01-13 DIAGNOSIS — M23222 Derangement of posterior horn of medial meniscus due to old tear or injury, left knee: Secondary | ICD-10-CM | POA: Diagnosis not present

## 2024-01-13 DIAGNOSIS — G4730 Sleep apnea, unspecified: Secondary | ICD-10-CM | POA: Diagnosis not present

## 2024-01-13 DIAGNOSIS — Z87891 Personal history of nicotine dependence: Secondary | ICD-10-CM | POA: Diagnosis not present

## 2024-01-13 DIAGNOSIS — I10 Essential (primary) hypertension: Secondary | ICD-10-CM | POA: Insufficient documentation

## 2024-01-13 DIAGNOSIS — G8929 Other chronic pain: Secondary | ICD-10-CM | POA: Diagnosis present

## 2024-01-13 DIAGNOSIS — M25561 Pain in right knee: Secondary | ICD-10-CM | POA: Diagnosis present

## 2024-01-13 HISTORY — DX: Presence of spectacles and contact lenses: Z97.3

## 2024-01-13 HISTORY — DX: Nonrheumatic mitral (valve) insufficiency: I34.0

## 2024-01-13 HISTORY — DX: Unspecified osteoarthritis, unspecified site: M19.90

## 2024-01-13 HISTORY — DX: Sleep apnea, unspecified: G47.30

## 2024-01-13 HISTORY — DX: Diaphragmatic hernia without obstruction or gangrene: K44.9

## 2024-01-13 HISTORY — PX: KNEE ARTHROSCOPY WITH MEDIAL MENISECTOMY: SHX5651

## 2024-01-13 HISTORY — DX: Personal history of urinary calculi: Z87.442

## 2024-01-13 HISTORY — DX: Generalized anxiety disorder: F41.1

## 2024-01-13 HISTORY — DX: Migraine, unspecified, not intractable, without status migrainosus: G43.909

## 2024-01-13 HISTORY — DX: Agoraphobia with panic disorder: F40.01

## 2024-01-13 SURGERY — ARTHROSCOPY, KNEE, WITH MEDIAL MENISCECTOMY
Anesthesia: General | Site: Knee | Laterality: Right

## 2024-01-13 MED ORDER — DEXAMETHASONE SODIUM PHOSPHATE 4 MG/ML IJ SOLN
INTRAMUSCULAR | Status: DC | PRN
  Administered 2024-01-13: 4 mg via INTRAVENOUS

## 2024-01-13 MED ORDER — DEXAMETHASONE SODIUM PHOSPHATE 4 MG/ML IJ SOLN
INTRAMUSCULAR | Status: AC
Start: 2024-01-13 — End: ?
  Filled 2024-01-13: qty 1

## 2024-01-13 MED ORDER — LACTATED RINGERS IV SOLN: INTRAVENOUS | Status: DC

## 2024-01-13 MED ORDER — ACETAMINOPHEN 500 MG PO TABS: 1000.0000 mg | ORAL_TABLET | Freq: Three times a day (TID) | ORAL | 2 refills | Status: AC

## 2024-01-13 MED ORDER — PROPOFOL 10 MG/ML IV BOLUS
INTRAVENOUS | Status: DC | PRN
  Administered 2024-01-13: 200 mg via INTRAVENOUS

## 2024-01-13 MED ORDER — SEVOFLURANE IN SOLN
RESPIRATORY_TRACT | Status: AC
  Filled 2024-01-13: qty 250

## 2024-01-13 MED ORDER — ONDANSETRON HCL 4 MG/2ML IJ SOLN
INTRAMUSCULAR | Status: AC
  Filled 2024-01-13: qty 2

## 2024-01-13 MED ORDER — LIDOCAINE HCL (PF) 2 % IJ SOLN
INTRAMUSCULAR | Status: AC
  Filled 2024-01-13: qty 5

## 2024-01-13 MED ORDER — ONDANSETRON 4 MG PO TBDP
4.0000 mg | ORAL_TABLET | Freq: Three times a day (TID) | ORAL | 0 refills | Status: AC | PRN
Start: 2024-01-13 — End: ?

## 2024-01-13 MED ORDER — FENTANYL CITRATE PF 50 MCG/ML IJ SOSY: 50.0000 ug | PREFILLED_SYRINGE | INTRAMUSCULAR | Status: DC | PRN

## 2024-01-13 MED ORDER — IBUPROFEN 800 MG PO TABS: 800.0000 mg | ORAL_TABLET | Freq: Three times a day (TID) | ORAL | 0 refills | Status: AC

## 2024-01-13 MED ORDER — ONDANSETRON 4 MG PO TBDP
4.0000 mg | ORAL_TABLET | Freq: Once | ORAL | Status: AC
  Administered 2024-01-13: 4 mg via ORAL

## 2024-01-13 MED ORDER — SODIUM CHLORIDE 0.9 % IV SOLN: INTRAVENOUS | Status: DC

## 2024-01-13 MED ORDER — MIDAZOLAM HCL 5 MG/5ML IJ SOLN
INTRAMUSCULAR | Status: DC | PRN
  Administered 2024-01-13: 2 mg via INTRAVENOUS

## 2024-01-13 MED ORDER — ACETAMINOPHEN 10 MG/ML IV SOLN
INTRAVENOUS | Status: DC | PRN
Start: 2024-01-13 — End: 2024-01-13
  Administered 2024-01-13: 1000 mg via INTRAVENOUS

## 2024-01-13 MED ORDER — SUCCINYLCHOLINE CHLORIDE 200 MG/10ML IV SOSY
PREFILLED_SYRINGE | INTRAVENOUS | Status: DC | PRN
  Administered 2024-01-13: 140 mg via INTRAVENOUS

## 2024-01-13 MED ORDER — CEFAZOLIN SODIUM-DEXTROSE 2-4 GM/100ML-% IV SOLN
2.0000 g | INTRAVENOUS | Status: AC
  Administered 2024-01-13: 2 g via INTRAVENOUS

## 2024-01-13 MED ORDER — ONDANSETRON HCL 4 MG/2ML IJ SOLN
INTRAMUSCULAR | Status: DC | PRN
  Administered 2024-01-13: 4 mg via INTRAVENOUS

## 2024-01-13 MED ORDER — HYDROCODONE-ACETAMINOPHEN 5-325 MG PO TABS
1.0000 | ORAL_TABLET | ORAL | 0 refills | Status: AC | PRN
  Filled 2024-01-13: qty 10, 1d supply, fill #0

## 2024-01-13 MED ORDER — HYDROCODONE-ACETAMINOPHEN 5-325 MG PO TABS: 1.0000 | ORAL_TABLET | ORAL | 0 refills | Status: AC | PRN

## 2024-01-13 MED ORDER — OXYCODONE HCL 5 MG PO TABS
ORAL_TABLET | ORAL | Status: AC
  Filled 2024-01-13: qty 2

## 2024-01-13 MED ORDER — PROPOFOL 10 MG/ML IV BOLUS
INTRAVENOUS | Status: AC
  Filled 2024-01-13: qty 40

## 2024-01-13 MED ORDER — MIDAZOLAM HCL 2 MG/2ML IJ SOLN
INTRAMUSCULAR | Status: AC
  Filled 2024-01-13: qty 2

## 2024-01-13 MED ORDER — CEFAZOLIN SODIUM-DEXTROSE 2-3 GM-%(50ML) IV SOLR
INTRAVENOUS | Status: AC
Start: 2024-01-13 — End: ?
  Filled 2024-01-13: qty 50

## 2024-01-13 MED ORDER — ASPIRIN 325 MG PO TBEC: 325.0000 mg | DELAYED_RELEASE_TABLET | Freq: Every day | ORAL | 0 refills | Status: AC

## 2024-01-13 MED ORDER — FENTANYL CITRATE (PF) 100 MCG/2ML IJ SOLN
INTRAMUSCULAR | Status: AC
  Filled 2024-01-13: qty 2

## 2024-01-13 MED ORDER — ONDANSETRON 4 MG PO TBDP
ORAL_TABLET | ORAL | Status: AC
  Filled 2024-01-13: qty 1

## 2024-01-13 MED ORDER — OXYCODONE HCL 5 MG PO TABS
10.0000 mg | ORAL_TABLET | Freq: Once | ORAL | Status: AC
  Administered 2024-01-13: 10 mg via ORAL

## 2024-01-13 MED ORDER — LIDOCAINE-EPINEPHRINE 1 %-1:100000 IJ SOLN
INTRAMUSCULAR | Status: DC | PRN
  Administered 2024-01-13: 10 mL
  Administered 2024-01-13: 5 mL

## 2024-01-13 MED ORDER — LACTATED RINGERS IR SOLN
Status: DC | PRN
  Administered 2024-01-13: 6000 mL

## 2024-01-13 MED ORDER — LIDOCAINE HCL (CARDIAC) PF 100 MG/5ML IV SOSY
PREFILLED_SYRINGE | INTRAVENOUS | Status: DC | PRN
  Administered 2024-01-13: 100 mg via INTRATRACHEAL

## 2024-01-13 MED ORDER — FENTANYL CITRATE (PF) 100 MCG/2ML IJ SOLN
INTRAMUSCULAR | Status: DC | PRN
  Administered 2024-01-13: 100 ug via INTRAVENOUS

## 2024-01-13 MED ORDER — ACETAMINOPHEN 10 MG/ML IV SOLN
INTRAVENOUS | Status: AC
  Filled 2024-01-13: qty 100

## 2024-01-13 SURGICAL SUPPLY — 26 items
BLADE FULL RADIUS 3.5 (BLADE) ×1 IMPLANT
BLADE SURG SZ11 CARB STEEL (BLADE) ×1 IMPLANT
BNDG COHESIVE 4X5 TAN STRL LF (GAUZE/BANDAGES/DRESSINGS) ×1 IMPLANT
BNDG ESMARCH 6X12 STRL LF (GAUZE/BANDAGES/DRESSINGS) ×1 IMPLANT
CHLORAPREP W/TINT 26 (MISCELLANEOUS) ×1 IMPLANT
COOLER POLAR GLACIER W/PUMP (MISCELLANEOUS) ×1 IMPLANT
COVER LIGHT HANDLE UNIVERSAL (MISCELLANEOUS) ×3 IMPLANT
DRAPE EXTREMITY T 121X128X90 (DISPOSABLE) ×1 IMPLANT
DRAPE IMP U-DRAPE 54X76 (DRAPES) ×1 IMPLANT
GAUZE SPONGE 4X4 12PLY STRL (GAUZE/BANDAGES/DRESSINGS) ×1 IMPLANT
GLOVE SRG 8 PF TXTR STRL LF DI (GLOVE) ×1 IMPLANT
GLOVE SURG SS PI 7.5 STRL IVOR (GLOVE) ×1 IMPLANT
GOWN STRL REUS W/ TWL LRG LVL3 (GOWN DISPOSABLE) ×1 IMPLANT
IV LR IRRIG 3000ML ARTHROMATIC (IV SOLUTION) ×2 IMPLANT
KIT TURNOVER KIT A (KITS) ×1 IMPLANT
MANIFOLD NEPTUNE II (INSTRUMENTS) ×1 IMPLANT
MAT ABSORB FLUID 56X50 GRAY (MISCELLANEOUS) ×1 IMPLANT
PACK ARTHROSCOPY KNEE (MISCELLANEOUS) ×1 IMPLANT
PAD ABD DERMACEA PRESS 5X9 (GAUZE/BANDAGES/DRESSINGS) ×1 IMPLANT
PAD WRAPON POLAR KNEE (MISCELLANEOUS) ×1 IMPLANT
SET Y ADAPTER MULIT-BAG IRRIG (MISCELLANEOUS) IMPLANT
SUTURE EHLN 3-0 FS-10 30 BLK (SUTURE) ×1 IMPLANT
TOWEL OR 17X26 4PK STRL BLUE (TOWEL DISPOSABLE) ×2 IMPLANT
TUBE SET DOUBLEFLO INFLOW (TUBING) ×1 IMPLANT
TUBING OUTFLOW SET DBLFO PUMP (TUBING) ×1 IMPLANT
WAND WEREWOLF FLOW 90D (MISCELLANEOUS) ×1 IMPLANT

## 2024-01-13 NOTE — Transfer of Care (Deleted)
 Immediate Anesthesia Transfer of Care Note  Patient: Jonathan Aguirre  Procedure(s) Performed: ARTHROSCOPY, KNEE, WITH MEDIAL MENISCECTOMY (Right: Knee)  Patient Location: PACU  Anesthesia Type: General ETT  Level of Consciousness: awake, alert  and patient cooperative  Airway and Oxygen Therapy: Patient Spontanous Breathing and Patient connected to supplemental oxygen  Post-op Assessment: Post-op Vital signs reviewed, Patient's Cardiovascular Status Stable, Respiratory Function Stable, Patent Airway and No signs of Nausea or vomiting  Post-op Vital Signs: Reviewed and stable  Complications: No notable events documented.

## 2024-01-13 NOTE — Anesthesia Procedure Notes (Addendum)
 Procedure Name: Intubation Date/Time: 01/13/2024 1:16 PM  Performed by: Sullivan Blasing, [place], CRNAPre-anesthesia Checklist: Patient identified, Patient being monitored, Timeout performed, Emergency Drugs available and Suction available Patient Re-evaluated:Patient Re-evaluated prior to induction Oxygen Delivery Method: Circle system utilized Preoxygenation: Pre-oxygenation with 100% oxygen Induction Type: IV induction, Rapid sequence and Cricoid Pressure applied Laryngoscope Size: Mac and 4 Grade View: Grade I Tube type: Oral Tube size: 7.5 mm Number of attempts: 1 Airway Equipment and Method: Stylet Placement Confirmation: ETT inserted through vocal cords under direct vision, positive ETCO2 and breath sounds checked- equal and bilateral Secured at: 24 cm Tube secured with: Tape Dental Injury: Teeth and Oropharynx as per pre-operative assessment

## 2024-01-13 NOTE — H&P (Signed)
 Paper H&P to be scanned into permanent record. H&P reviewed. No significant changes noted.

## 2024-01-13 NOTE — Discharge Instructions (Signed)
Arthroscopic Knee Surgery - Partial Meniscectomy   Post-Op Instructions   1. Bracing or crutches: Crutches will be provided at the time of discharge from the surgery center if you do not already have them.   2. Ice: You may be provided with a device (Polar Care) that allows you to ice the affected area effectively. Otherwise you can ice manually.    3. Driving:  Plan on not driving for at least two weeks. Please note that you are advised NOT to drive while taking narcotic pain medications as you may be impaired and unsafe to drive.   4. Activity: Ankle pumps several times an hour while awake to prevent blood clots. Weight bearing: as tolerated. Use crutches for as needed (usually ~1 week or less) until pain allows you to ambulate without a limp. Bending and straightening the knee is unlimited. Elevate knee above heart level as much as possible for one week. Avoid standing more than 5 minutes (consecutively) for the first week.  Avoid long distance travel for 2 weeks.  5. Medications:  - You have been provided a prescription for narcotic pain medicine. After surgery, take 1-2 narcotic tablets every 4 hours if needed for severe pain.  - You may take up to 3000mg/day of tylenol (acetaminophen). You can take 1000mg 3x/day. Please check your narcotic. If you have acetaminophen in your narcotic (each tablet will be 325mg), be careful not to exceed a total of 3000mg/day of acetaminophen.  - A prescription for anti-nausea medication will be provided in case the narcotic medicine or anesthesia causes nausea - take 1 tablet every 6 hours only if nauseated.  - Take ibuprofen 800 mg every 8 hours WITH food to reduce post-operative knee swelling. DO NOT STOP IBUPROFEN POST-OP UNTIL INSTRUCTED TO DO SO at first post-op office visit (10-14 days after surgery). However, please discontinue if you have any abdominal discomfort after taking this.  - Take enteric coated aspirin 325 mg once daily for 2 weeks to prevent  blood clots.    6. Bandages: The physical therapist should change the bandages at the first post-op appointment. If needed, the dressing supplies have been provided to you.   7. Physical Therapy: 1-2 times per week for 6 weeks. Therapy typically starts on post operative Day 3 or 4. You have been provided an order for physical therapy. The therapist will provide home exercises.   8. Work: May do light duty/desk job in approximately 1-2 weeks when off of narcotics, pain is well-controlled, and swelling has decreased. Labor intensive jobs may require 4-6 weeks to return.      9. Post-Op Appointments: Your first post-op appointment will be with Dr. Nichoel Digiulio in approximately 2 weeks time.    If you find that they have not been scheduled please call the Orthopaedic Appointment front desk at [phone].  

## 2024-01-13 NOTE — Anesthesia Postprocedure Evaluation (Signed)
 Anesthesia Post Note  Patient: Jonathan Aguirre  Procedure(s) Performed: ARTHROSCOPY, KNEE, WITH MEDIAL MENISCECTOMY (Right: Knee)  Patient location during evaluation: PACU Anesthesia Type: General Level of consciousness: awake and alert Pain management: pain level controlled Vital Signs Assessment: post-procedure vital signs reviewed and stable Respiratory status: spontaneous breathing, nonlabored ventilation, respiratory function stable and patient connected to nasal cannula oxygen Cardiovascular status: blood pressure returned to baseline and stable Postop Assessment: no apparent nausea or vomiting Anesthetic complications: no   No notable events documented.   Last Vitals:  Vitals:   01/13/24 1227 01/13/24 1430  BP: (!) 123/92 116/87  Pulse: 66 68  Resp: 16 17  Temp: 36.8 C   SpO2: 98% 96%    Last Pain:  Vitals:   01/13/24 1426  TempSrc:   PainSc: 5                  Turner Baillie C Parisha Beaulac

## 2024-01-13 NOTE — Op Note (Signed)
 Operative Note    SURGERY DATE: 01/13/2024   PRE-OP DIAGNOSIS:  1. Left medial meniscus tear 2. Left medial and patellofemoral compartment degenerative changes   POST-OP DIAGNOSIS:  1. Left medial meniscus tear 2. Left medial and patellofemoral compartment degenerative changes   PROCEDURES:  1.  Left knee arthroscopy, partial medial meniscectomy 2.  Left knee chondroplasty of patellofemoral and medial compartments   SURGEON: Cleotilde Dago, MD   ANESTHESIA: Gen   ESTIMATED BLOOD LOSS: minimal   TOTAL IV FLUIDS: per anesthesia   INDICATION(S):  Jonathan Aguirre is a 53 y.o. male with signs and symptoms as well as MRI finding of medial meniscus tear.  He had failed extensive nonoperative management and was unable to continue working. After discussion of risks, benefits, and alternatives to surgery, the patient elected to proceed.   OPERATIVE FINDINGS:    Examination under anesthesia: A careful examination under anesthesia was performed.  Passive range of motion was: Hyperextension: 2.  Extension: 0.  Flexion: 130.  Lachman: normal. Pivot Shift: normal.  Posterior drawer: normal.  Varus stability in full extension: normal.  Varus stability in 30 degrees of flexion: normal.  Valgus stability in full extension: normal.  Valgus stability in 30 degrees of flexion: normal.   Intra-operative findings: A thorough arthroscopic examination of the knee was performed.  The findings are: 1. Suprapatellar pouch: Normal 2. Undersurface of median ridge: Grade 4 changes superiorly 3. Medial patellar facet: Extension of grade 4 changes 4. Lateral patellar facet: Extension of grade 4 changes 5. Trochlea: Focal area of grade 3-4 changes distally in trochlear groove 6. Lateral gutter/popliteus tendon: Normal 7. Hoffa's fat pad: Inflamed 8. Medial gutter/plica: Normal 9. ACL: Normal 10. PCL: Normal 11. Medial meniscus: Horizontal/oblique tear of the undersurface of the posterior horn/body  12.  Medial compartment cartilage: Extensive grade 2-3 degenerative changes of the femoral condyle; scattered grade 2 degenerative changes of the tibial plateau 13. Lateral meniscus: Normal 14. Lateral compartment cartilage: Grade 1 softening to the tibial plateau; normal lateral femoral condyle   OPERATIVE REPORT:     I identified Jonathan Aguirre in the pre-operative holding area. I marked the operative knee with my initials. I reviewed the risks and benefits of the proposed surgical intervention and the patient wished to proceed. The patient was transferred to the operative suite and placed in the supine position with all bony prominences padded.  Anesthesia was administered. Appropriate IV antibiotics were administered prior to incision. The extremity was then prepped and draped in standard fashion. A time out was performed confirming the correct extremity, correct patient, and correct procedure.   Arthroscopy portals were marked. Local anesthetic was injected to the planned portal sites. The anterolateral portal was established with an 11 blade.      The arthroscope was placed in the anterolateral portal and then into the suprapatellar pouch. Next, the medial portal was established under needle localization. A diagnostic knee scope was completed with the above findings. The medial meniscus tear was identified.   The MCL was pie-crusted to improve visualization of the posterior horn. The meniscal tear was debrided using an arthroscopic biter and an oscillating shaver until the meniscus had stable borders.  This involved partial resection of the undersurface of the medial meniscus at the level of the posterior horn and extending to the body.  A chondroplasty was performed of the medial compartment and patellofemoral compartment such that there were stable cartilage edges without any loose fragments of cartilage. Arthroscopic fluid was  removed from the joint.   The portals were closed with 3-0 Nylon  suture. Sterile dressings included Xeroform, 4x4s, Sof-Rol, and Bias wrap. A Polarcare was placed.  The patient was then awakened and taken to the PACU hemodynamically stable without complication.   POSTOPERATIVE PLAN: The patient will be discharged home today once they meet PACU criteria. Aspirin 325 mg daily was prescribed for 2 weeks for DVT prophylaxis.  Physical therapy will start on POD#3-4. Weight-bearing as tolerated. Follow up in 2 weeks per protocol.

## 2024-01-13 NOTE — Transfer of Care (Signed)
 Immediate Anesthesia Transfer of Care Note  Patient: Jonathan Aguirre  Procedure(s) Performed: ARTHROSCOPY, KNEE, WITH MEDIAL MENISCECTOMY (Right: Knee)  Patient Location: PACU  Anesthesia Type: General ETT  Level of Consciousness: awake, alert  and patient cooperative  Airway and Oxygen Therapy: Patient Spontanous Breathing and Patient connected to supplemental oxygen  Post-op Assessment: Post-op Vital signs reviewed, Patient's Cardiovascular Status Stable, Respiratory Function Stable, Patent Airway and No signs of Nausea or vomiting  Post-op Vital Signs: Reviewed and stable  Complications: No notable events documented.

## 2024-01-16 ENCOUNTER — Other Ambulatory Visit: Payer: Self-pay

## 2024-01-16 DIAGNOSIS — F411 Generalized anxiety disorder: Secondary | ICD-10-CM | POA: Diagnosis not present

## 2024-01-16 DIAGNOSIS — F5105 Insomnia due to other mental disorder: Secondary | ICD-10-CM | POA: Diagnosis not present

## 2024-01-16 DIAGNOSIS — F410 Panic disorder [episodic paroxysmal anxiety] without agoraphobia: Secondary | ICD-10-CM | POA: Diagnosis not present

## 2024-01-16 DIAGNOSIS — F331 Major depressive disorder, recurrent, moderate: Secondary | ICD-10-CM | POA: Diagnosis not present

## 2024-01-16 MED ORDER — TRAZODONE HCL 100 MG PO TABS
200.0000 mg | ORAL_TABLET | Freq: Every evening | ORAL | 0 refills | Status: DC | PRN
  Filled 2024-01-16: qty 90, 45d supply, fill #0
  Filled 2024-04-02: qty 90, 45d supply, fill #1

## 2024-01-16 MED ORDER — HYDROXYZINE PAMOATE 50 MG PO CAPS
50.0000 mg | ORAL_CAPSULE | Freq: Two times a day (BID) | ORAL | 0 refills | Status: DC
  Filled 2024-01-16: qty 180, 90d supply, fill #0

## 2024-01-16 MED ORDER — LAMOTRIGINE 150 MG PO TABS
150.0000 mg | ORAL_TABLET | Freq: Every day | ORAL | 0 refills | Status: DC
  Filled 2024-01-16: qty 90, 90d supply, fill #0

## 2024-01-16 MED ORDER — AMPHETAMINE-DEXTROAMPHETAMINE 20 MG PO TABS
ORAL_TABLET | ORAL | 0 refills | Status: DC
  Filled 2024-01-16: qty 235, 95d supply, fill #0

## 2024-01-17 ENCOUNTER — Other Ambulatory Visit: Payer: Self-pay

## 2024-01-17 DIAGNOSIS — M25561 Pain in right knee: Secondary | ICD-10-CM | POA: Diagnosis not present

## 2024-01-17 DIAGNOSIS — M6281 Muscle weakness (generalized): Secondary | ICD-10-CM | POA: Diagnosis not present

## 2024-01-17 DIAGNOSIS — M25661 Stiffness of right knee, not elsewhere classified: Secondary | ICD-10-CM | POA: Diagnosis not present

## 2024-01-17 DIAGNOSIS — S83241D Other tear of medial meniscus, current injury, right knee, subsequent encounter: Secondary | ICD-10-CM | POA: Diagnosis not present

## 2024-01-19 DIAGNOSIS — M25561 Pain in right knee: Secondary | ICD-10-CM | POA: Diagnosis not present

## 2024-01-19 DIAGNOSIS — M6281 Muscle weakness (generalized): Secondary | ICD-10-CM | POA: Diagnosis not present

## 2024-01-19 DIAGNOSIS — M25661 Stiffness of right knee, not elsewhere classified: Secondary | ICD-10-CM | POA: Diagnosis not present

## 2024-01-19 DIAGNOSIS — S83241D Other tear of medial meniscus, current injury, right knee, subsequent encounter: Secondary | ICD-10-CM | POA: Diagnosis not present

## 2024-01-24 DIAGNOSIS — M25561 Pain in right knee: Secondary | ICD-10-CM | POA: Diagnosis not present

## 2024-01-24 DIAGNOSIS — M25661 Stiffness of right knee, not elsewhere classified: Secondary | ICD-10-CM | POA: Diagnosis not present

## 2024-01-24 DIAGNOSIS — M6281 Muscle weakness (generalized): Secondary | ICD-10-CM | POA: Diagnosis not present

## 2024-01-24 DIAGNOSIS — S83241D Other tear of medial meniscus, current injury, right knee, subsequent encounter: Secondary | ICD-10-CM | POA: Diagnosis not present

## 2024-01-27 DIAGNOSIS — M6281 Muscle weakness (generalized): Secondary | ICD-10-CM | POA: Diagnosis not present

## 2024-01-27 DIAGNOSIS — S83241D Other tear of medial meniscus, current injury, right knee, subsequent encounter: Secondary | ICD-10-CM | POA: Diagnosis not present

## 2024-01-27 DIAGNOSIS — M25561 Pain in right knee: Secondary | ICD-10-CM | POA: Diagnosis not present

## 2024-01-27 DIAGNOSIS — M25661 Stiffness of right knee, not elsewhere classified: Secondary | ICD-10-CM | POA: Diagnosis not present

## 2024-02-03 DIAGNOSIS — S83241D Other tear of medial meniscus, current injury, right knee, subsequent encounter: Secondary | ICD-10-CM | POA: Diagnosis not present

## 2024-02-07 DIAGNOSIS — M25661 Stiffness of right knee, not elsewhere classified: Secondary | ICD-10-CM | POA: Diagnosis not present

## 2024-02-07 DIAGNOSIS — M25561 Pain in right knee: Secondary | ICD-10-CM | POA: Diagnosis not present

## 2024-02-07 DIAGNOSIS — S83241D Other tear of medial meniscus, current injury, right knee, subsequent encounter: Secondary | ICD-10-CM | POA: Diagnosis not present

## 2024-02-07 DIAGNOSIS — M6281 Muscle weakness (generalized): Secondary | ICD-10-CM | POA: Diagnosis not present

## 2024-02-19 ENCOUNTER — Emergency Department

## 2024-02-19 ENCOUNTER — Emergency Department
Admission: EM | Admit: 2024-02-19 | Discharge: 2024-02-19 | Disposition: A | Discharge status: Home / Self Care | Attending: Emergency Medicine | Admitting: Emergency Medicine

## 2024-02-19 ENCOUNTER — Other Ambulatory Visit: Payer: Self-pay

## 2024-02-19 DIAGNOSIS — M25551 Pain in right hip: Secondary | ICD-10-CM | POA: Insufficient documentation

## 2024-02-19 DIAGNOSIS — I10 Essential (primary) hypertension: Secondary | ICD-10-CM | POA: Insufficient documentation

## 2024-02-19 DIAGNOSIS — S79911A Unspecified injury of right hip, initial encounter: Secondary | ICD-10-CM | POA: Diagnosis not present

## 2024-02-19 MED ORDER — METHOCARBAMOL 500 MG PO TABS
750.0000 mg | ORAL_TABLET | Freq: Once | ORAL | Status: AC
  Administered 2024-02-19: 750 mg via ORAL
  Filled 2024-02-19: qty 2

## 2024-02-19 MED ORDER — KETOROLAC TROMETHAMINE 15 MG/ML IJ SOLN
15.0000 mg | Freq: Once | INTRAMUSCULAR | Status: AC
  Administered 2024-02-19: 15 mg via INTRAMUSCULAR
  Filled 2024-02-19: qty 1

## 2024-02-19 MED ORDER — OXYCODONE-ACETAMINOPHEN 5-325 MG PO TABS
1.0000 | ORAL_TABLET | ORAL | 0 refills | Status: AC | PRN
  Filled 2024-02-19: qty 20, 4d supply, fill #0

## 2024-02-19 MED ORDER — HYDROCODONE-ACETAMINOPHEN 5-325 MG PO TABS
1.0000 | ORAL_TABLET | Freq: Once | ORAL | Status: AC
  Administered 2024-02-19: 1 via ORAL
  Filled 2024-02-19: qty 1

## 2024-02-19 MED ORDER — OXYCODONE HCL 5 MG PO TABS
5.0000 mg | ORAL_TABLET | Freq: Once | ORAL | Status: AC
  Administered 2024-02-19: 5 mg via ORAL
  Filled 2024-02-19: qty 1

## 2024-02-19 NOTE — Discharge Instructions (Signed)
 The xray and CT scan of your hip were negative for fractures.  Please schedule follow-up appointment with orthopedics.  Their information is attached.  You can take 650 mg of Tylenol  and 600 mg of ibuprofen  every 6 hours as needed for pain. You can use ice, heat, muscle creams and other topical pain relievers as well.  I have sent a strong pain medication called Percocet to the pharmacy.  This medication can be taken every 4-6 hours as needed for severe or breakthrough pain.  This medication can cause dependency so only take if you are unable to control your pain with other medications.  It will make you sleepy so do not drive or operate heavy machinery after taking it.  Do not drink alcohol while taking this medication.  This medication can also cause constipation so please take an over-the-counter stool softener like MiraLAX or Colace while taking it.  This medication contains both oxycodone  and acetaminophen .  Do not take Tylenol  at the same time.  You can take the Percocet or Tylenol  but not both.

## 2024-02-19 NOTE — ED Notes (Signed)
 Pt states he was trying to get his cow back when someone tied a rope and went to go toss it to catch the cow but, The rope got caught under his foot causing him to fall on his right hip.  Pt states 10/10 pain and is unable to put any weight on same.

## 2024-02-19 NOTE — ED Provider Notes (Signed)
 [facility] Provider Note    Event Date/Time   First MD Initiated Contact with Patient 02/19/24 1901     (approximate)   History   Fall   HPI  Jonathan Aguirre is a 53 y.o. male with PMH of hypertension, anxiety and depression presents for evaluation of right hip pain after a fall.  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  Patient did not hit his head.  Reports significant pain to the hip, with mild pain to the right shoulder.  Unable to walk.    Physical Exam   Triage Vital Signs: ED Triage Vitals  Encounter Vitals Group     BP 02/19/24 1609 (!) 142/90     Girls Systolic BP Percentile --      Girls Diastolic BP Percentile --      Boys Systolic BP Percentile --      Boys Diastolic BP Percentile --      Pulse Rate 02/19/24 1609 77     Resp 02/19/24 1609 16     Temp 02/19/24 1609 98.2 F (36.8 C)     Temp Source 02/19/24 1609 Oral     SpO2 02/19/24 1609 100 %     Weight 02/19/24 1613 250 lb (113.4 kg)     Height 02/19/24 1613 6' 1 (1.854 m)     Head Circumference --      Peak Flow --      Pain Score 02/19/24 1612 8     Pain Loc --      Pain Education --      Exclude from Growth Chart --     Most recent vital signs: Vitals:   02/19/24 2130 02/19/24 2230  BP: 127/84 (!) 141/100  Pulse: 64 67  Resp: 18 18  Temp:  98.2 F (36.8 C)  SpO2: 94% 100%   General: Awake, no distress.  CV:  Good peripheral perfusion.  RRR Resp:  Normal effort.  CTAB. Abd:  No distention.  Other:  No overlying skin changes or bruising to the right hip, very tender to palpation at the hip and greater trochanter, mild tenderness along the medial joint line of the knee but this is where previous incision from meniscus repair is located, no tenderness in the femur.  Hip range of motion  very limited due to pain.   ED Results / Procedures / Treatments   Labs (all labs ordered are listed, but only abnormal results are displayed) Labs Reviewed - No data to display   RADIOLOGY  Right hip x-ray and CT scan obtained, I interpreted the images as well as reviewed the radiologist report.  Both are negative for acute abnormalities.  PROCEDURES:  Critical Care performed: No  Procedures   MEDICATIONS ORDERED IN ED: Medications  methocarbamol  (ROBAXIN ) tablet 750 mg (750 mg Oral Given 02/19/24 2037)  HYDROcodone -acetaminophen  (NORCO/VICODIN) 5-325 MG per tablet 1 tablet (1 tablet Oral Given 02/19/24 2008)  oxyCODONE  (Oxy IR/ROXICODONE ) immediate release tablet 5 mg (5 mg Oral Given 02/19/24 2140)  oxyCODONE  (Oxy IR/ROXICODONE ) immediate release tablet 5 mg (5 mg Oral Given 02/19/24 2248)  ketorolac  (TORADOL ) 15 MG/ML injection 15 mg (15 mg Intramuscular Given 02/19/24 2248)     IMPRESSION / MDM / ASSESSMENT AND PLAN / ED COURSE  I reviewed the triage vital signs and the nursing notes.                             53 year old male presents for evaluation of right hip pain after a fall.  Blood pressure is elevated vital signs stable otherwise.  Patient very uncomfortable on exam.  Differential diagnosis includes, but is not limited to, fracture, dislocation, contusion, bursitis.  Patient's presentation is most consistent with acute complicated illness / injury requiring diagnostic workup.  Right hip x-ray is negative for fractures but patient is still unable to bear weight so we will obtain a CT scan to rule out occult fractures.  CT scan is also negative.  Patient was given Robaxin  and Norco for pain control initially.  Patient was still having significant pain so he was given oxycodone  and Toradol .  After receiving these medications he was able to put some weight on his foot but still has a lot of pain with weightbearing.  Patient has crutches from his previous knee surgery  so he declined a walker at this time.  Do feel that patient is stable for outpatient management.  Will send him with a prescription for pain medication.  He already has an established relationship with Dr. Tobie due to his knee surgery so encouraged him to follow-up with them about his hip.  Patient was in agreement with plan, voiced understanding, stable at discharge.       FINAL CLINICAL IMPRESSION(S) / ED DIAGNOSES   Final diagnoses:  Right hip pain     Rx / DC Orders   ED Discharge Orders          Ordered    oxyCODONE -acetaminophen  (PERCOCET) 5-325 MG tablet  Every 4 hours PRN        02/19/24 2240             Note:  This document was prepared using Dragon voice recognition software and may include unintentional dictation errors.   Cleaster Tinnie LABOR, PA-C 02/19/24 2301    Jossie Artist POUR, MD 02/20/24 1539

## 2024-02-19 NOTE — ED Triage Notes (Signed)
 Pt complains of right hip pain following a fall. States he can hardly walk.

## 2024-02-19 NOTE — ED Notes (Signed)
 Patient given discharge instructions including prescriptions x1 and importance of follow up appt as needed with stated understanding. Patient stable and wheeled out to car for safe ride from spouse on dispo.

## 2024-02-19 NOTE — ED Notes (Signed)
 Patient stated pain better after oxycodone . Attempted to ambulate, patient is able to bear weight but has significant pain when attempting to move. Cleaster, PA at bedside.

## 2024-02-20 ENCOUNTER — Other Ambulatory Visit: Payer: Self-pay

## 2024-02-27 ENCOUNTER — Other Ambulatory Visit: Payer: Self-pay

## 2024-02-27 MED ORDER — RIZATRIPTAN BENZOATE 5 MG PO TABS
5.0000 mg | ORAL_TABLET | ORAL | 0 refills | Status: AC
  Filled 2024-02-27: qty 10, 17d supply, fill #0

## 2024-02-27 MED ORDER — ONDANSETRON 4 MG PO TBDP
4.0000 mg | ORAL_TABLET | Freq: Two times a day (BID) | ORAL | 1 refills | Status: AC | PRN
  Filled 2024-02-27: qty 30, 15d supply, fill #0

## 2024-03-05 ENCOUNTER — Other Ambulatory Visit: Payer: Self-pay

## 2024-03-05 DIAGNOSIS — S83241D Other tear of medial meniscus, current injury, right knee, subsequent encounter: Secondary | ICD-10-CM | POA: Diagnosis not present

## 2024-03-05 DIAGNOSIS — M25551 Pain in right hip: Secondary | ICD-10-CM | POA: Diagnosis not present

## 2024-03-05 MED ORDER — METHYLPREDNISOLONE 4 MG PO TBPK
ORAL_TABLET | ORAL | 0 refills | Status: AC
  Filled 2024-03-05: qty 21, 6d supply, fill #0

## 2024-03-08 ENCOUNTER — Other Ambulatory Visit: Payer: Self-pay

## 2024-03-08 MED ORDER — ROSUVASTATIN CALCIUM 5 MG PO TABS
5.0000 mg | ORAL_TABLET | Freq: Every day | ORAL | 0 refills | Status: DC
  Filled 2024-03-08: qty 90, 90d supply, fill #0

## 2024-03-09 ENCOUNTER — Other Ambulatory Visit: Payer: Self-pay

## 2024-04-03 DIAGNOSIS — G4733 Obstructive sleep apnea (adult) (pediatric): Secondary | ICD-10-CM | POA: Diagnosis not present

## 2024-04-04 ENCOUNTER — Other Ambulatory Visit: Payer: Self-pay

## 2024-04-05 ENCOUNTER — Other Ambulatory Visit: Payer: Self-pay

## 2024-04-05 DIAGNOSIS — M25552 Pain in left hip: Secondary | ICD-10-CM | POA: Diagnosis not present

## 2024-04-05 DIAGNOSIS — M25559 Pain in unspecified hip: Secondary | ICD-10-CM | POA: Diagnosis not present

## 2024-04-05 MED ORDER — OMEPRAZOLE 40 MG PO CPDR
40.0000 mg | DELAYED_RELEASE_CAPSULE | Freq: Two times a day (BID) | ORAL | 1 refills | Status: AC
  Filled 2024-04-05: qty 180, 90d supply, fill #0
  Filled 2024-06-30: qty 180, 90d supply, fill #1

## 2024-04-16 ENCOUNTER — Other Ambulatory Visit: Payer: Self-pay

## 2024-04-16 DIAGNOSIS — F410 Panic disorder [episodic paroxysmal anxiety] without agoraphobia: Secondary | ICD-10-CM | POA: Diagnosis not present

## 2024-04-16 DIAGNOSIS — F331 Major depressive disorder, recurrent, moderate: Secondary | ICD-10-CM | POA: Diagnosis not present

## 2024-04-16 DIAGNOSIS — F5105 Insomnia due to other mental disorder: Secondary | ICD-10-CM | POA: Diagnosis not present

## 2024-04-16 DIAGNOSIS — F411 Generalized anxiety disorder: Secondary | ICD-10-CM | POA: Diagnosis not present

## 2024-04-16 MED ORDER — LAMOTRIGINE 150 MG PO TABS
150.0000 mg | ORAL_TABLET | Freq: Every day | ORAL | 0 refills | Status: DC
  Filled 2024-04-16: qty 90, 90d supply, fill #0

## 2024-04-16 MED ORDER — HYDROXYZINE PAMOATE 50 MG PO CAPS
50.0000 mg | ORAL_CAPSULE | Freq: Two times a day (BID) | ORAL | 0 refills | Status: DC
  Filled 2024-04-16: qty 180, 90d supply, fill #0

## 2024-04-16 MED ORDER — TRAZODONE HCL 100 MG PO TABS
200.0000 mg | ORAL_TABLET | Freq: Every evening | ORAL | 0 refills | Status: AC | PRN
  Filled 2024-04-16: qty 180, 90d supply, fill #0

## 2024-04-16 MED ORDER — AMPHETAMINE-DEXTROAMPHETAMINE 20 MG PO TABS
ORAL_TABLET | ORAL | 0 refills | Status: DC
  Filled 2024-04-16 – 2024-04-25 (×3): qty 235, 94d supply, fill #0

## 2024-04-17 ENCOUNTER — Other Ambulatory Visit: Payer: Self-pay

## 2024-04-20 ENCOUNTER — Other Ambulatory Visit: Payer: Self-pay

## 2024-04-20 ENCOUNTER — Other Ambulatory Visit (HOSPITAL_COMMUNITY): Payer: Self-pay

## 2024-04-25 ENCOUNTER — Other Ambulatory Visit: Payer: Self-pay

## 2024-05-02 ENCOUNTER — Other Ambulatory Visit (HOSPITAL_COMMUNITY): Payer: Self-pay

## 2024-05-04 ENCOUNTER — Other Ambulatory Visit: Payer: Self-pay

## 2024-05-23 ENCOUNTER — Other Ambulatory Visit: Payer: Self-pay

## 2024-05-23 MED ORDER — TRAZODONE HCL 100 MG PO TABS
200.0000 mg | ORAL_TABLET | Freq: Every evening | ORAL | 0 refills | Status: AC | PRN
  Filled 2024-05-23: qty 180, 180d supply, fill #0

## 2024-05-23 MED ORDER — TRAZODONE HCL 100 MG PO TABS
200.0000 mg | ORAL_TABLET | Freq: Every evening | ORAL | 0 refills | Status: AC | PRN
  Filled 2024-05-23: qty 180, 90d supply, fill #0

## 2024-05-23 MED ORDER — LAMOTRIGINE 150 MG PO TABS
150.0000 mg | ORAL_TABLET | Freq: Every day | ORAL | 0 refills | Status: AC
Start: 2024-05-23 — End: ?
  Filled 2024-05-23: qty 90, 90d supply, fill #0

## 2024-05-23 MED ORDER — LAMOTRIGINE 150 MG PO TABS
150.0000 mg | ORAL_TABLET | Freq: Every day | ORAL | 0 refills | Status: AC
  Filled 2024-05-23: qty 90, 90d supply, fill #0

## 2024-05-23 MED ORDER — HYDROXYZINE PAMOATE 50 MG PO CAPS
50.0000 mg | ORAL_CAPSULE | Freq: Two times a day (BID) | ORAL | 0 refills | Status: AC
  Filled 2024-05-23: qty 180, 90d supply, fill #0

## 2024-06-18 ENCOUNTER — Other Ambulatory Visit: Payer: Self-pay

## 2024-06-18 MED ORDER — ROSUVASTATIN CALCIUM 5 MG PO TABS
5.0000 mg | ORAL_TABLET | Freq: Every day | ORAL | 0 refills | Status: DC
  Filled 2024-06-18: qty 90, 90d supply, fill #0

## 2024-06-25 DIAGNOSIS — G4733 Obstructive sleep apnea (adult) (pediatric): Secondary | ICD-10-CM | POA: Diagnosis not present

## 2024-06-30 ENCOUNTER — Other Ambulatory Visit: Payer: Self-pay

## 2024-07-02 ENCOUNTER — Other Ambulatory Visit: Payer: Self-pay

## 2024-07-02 MED ORDER — RIZATRIPTAN BENZOATE 5 MG PO TABS
5.0000 mg | ORAL_TABLET | ORAL | 0 refills | Status: AC | PRN
  Filled 2024-07-02: qty 10, 20d supply, fill #0

## 2024-07-03 ENCOUNTER — Other Ambulatory Visit: Payer: Self-pay

## 2024-07-11 DIAGNOSIS — M25552 Pain in left hip: Secondary | ICD-10-CM | POA: Diagnosis not present

## 2024-07-11 DIAGNOSIS — G8929 Other chronic pain: Secondary | ICD-10-CM | POA: Diagnosis not present

## 2024-07-11 DIAGNOSIS — M25562 Pain in left knee: Secondary | ICD-10-CM | POA: Diagnosis not present

## 2024-07-16 ENCOUNTER — Other Ambulatory Visit: Payer: Self-pay

## 2024-07-16 DIAGNOSIS — F5105 Insomnia due to other mental disorder: Secondary | ICD-10-CM | POA: Diagnosis not present

## 2024-07-16 DIAGNOSIS — F411 Generalized anxiety disorder: Secondary | ICD-10-CM | POA: Diagnosis not present

## 2024-07-16 DIAGNOSIS — F331 Major depressive disorder, recurrent, moderate: Secondary | ICD-10-CM | POA: Diagnosis not present

## 2024-07-16 DIAGNOSIS — F410 Panic disorder [episodic paroxysmal anxiety] without agoraphobia: Secondary | ICD-10-CM | POA: Diagnosis not present

## 2024-07-16 MED ORDER — HYDROXYZINE PAMOATE 50 MG PO CAPS
50.0000 mg | ORAL_CAPSULE | Freq: Two times a day (BID) | ORAL | 0 refills | Status: AC
  Filled 2024-07-16: qty 180, 90d supply, fill #0

## 2024-07-16 MED ORDER — AMPHETAMINE-DEXTROAMPHETAMINE 20 MG PO TABS
ORAL_TABLET | ORAL | 0 refills | Status: AC
  Filled 2024-07-16 – 2024-08-08 (×2): qty 235, 94d supply, fill #0

## 2024-07-16 MED ORDER — LAMOTRIGINE 150 MG PO TABS
150.0000 mg | ORAL_TABLET | Freq: Every day | ORAL | 0 refills | Status: AC
  Filled 2024-07-16: qty 90, 90d supply, fill #0

## 2024-07-16 MED ORDER — TRAZODONE HCL 100 MG PO TABS
200.0000 mg | ORAL_TABLET | Freq: Every evening | ORAL | 0 refills | Status: AC | PRN
  Filled 2024-07-16 – 2024-08-10 (×2): qty 180, 90d supply, fill #0

## 2024-07-30 ENCOUNTER — Other Ambulatory Visit: Payer: Self-pay | Admitting: Internal Medicine

## 2024-07-30 ENCOUNTER — Other Ambulatory Visit: Payer: Self-pay

## 2024-07-30 DIAGNOSIS — G8929 Other chronic pain: Secondary | ICD-10-CM

## 2024-07-30 DIAGNOSIS — M25552 Pain in left hip: Secondary | ICD-10-CM

## 2024-07-30 MED ORDER — MUPIROCIN 2 % EX OINT
1.0000 | TOPICAL_OINTMENT | Freq: Two times a day (BID) | CUTANEOUS | 0 refills | Status: AC
  Filled 2024-07-30: qty 22, 11d supply, fill #0

## 2024-07-30 MED ORDER — CYCLOBENZAPRINE HCL 10 MG PO TABS
10.0000 mg | ORAL_TABLET | Freq: Two times a day (BID) | ORAL | 2 refills | Status: AC | PRN
  Filled 2024-07-30 (×3): qty 180, 90d supply, fill #0

## 2024-07-30 MED ORDER — DIAZEPAM 2 MG PO TABS
2.0000 mg | ORAL_TABLET | ORAL | 0 refills | Status: AC | PRN
  Filled 2024-07-30: qty 1, 1d supply, fill #0

## 2024-07-30 MED ORDER — RIZATRIPTAN BENZOATE 5 MG PO TABS
ORAL_TABLET | ORAL | 1 refills | Status: AC
  Filled 2024-07-30: qty 10, 5d supply, fill #0

## 2024-07-31 ENCOUNTER — Other Ambulatory Visit: Payer: Self-pay

## 2024-07-31 ENCOUNTER — Other Ambulatory Visit (HOSPITAL_COMMUNITY): Payer: Self-pay

## 2024-08-01 ENCOUNTER — Other Ambulatory Visit: Payer: Self-pay

## 2024-08-03 ENCOUNTER — Ambulatory Visit

## 2024-08-08 ENCOUNTER — Other Ambulatory Visit: Payer: Self-pay

## 2024-08-10 ENCOUNTER — Other Ambulatory Visit: Payer: Self-pay

## 2024-08-13 ENCOUNTER — Ambulatory Visit: Admission: RE | Admit: 2024-08-13 | Source: Ambulatory Visit

## 2024-08-13 ENCOUNTER — Ambulatory Visit
Admission: RE | Admit: 2024-08-13 | Discharge: 2024-08-13 | Disposition: A | Discharge status: Home / Self Care | Source: Ambulatory Visit | Attending: Internal Medicine | Admitting: Internal Medicine

## 2024-08-13 DIAGNOSIS — M25552 Pain in left hip: Secondary | ICD-10-CM | POA: Insufficient documentation

## 2024-08-13 DIAGNOSIS — G8929 Other chronic pain: Secondary | ICD-10-CM | POA: Diagnosis present

## 2024-08-13 DIAGNOSIS — M25562 Pain in left knee: Secondary | ICD-10-CM | POA: Insufficient documentation

## 2024-08-14 ENCOUNTER — Ambulatory Visit: Admitting: Physician Assistant

## 2024-09-11 ENCOUNTER — Other Ambulatory Visit: Payer: Self-pay

## 2024-09-11 MED ORDER — ROSUVASTATIN CALCIUM 5 MG PO TABS
5.0000 mg | ORAL_TABLET | Freq: Every day | ORAL | 0 refills | Status: AC
  Filled 2024-09-11: qty 90, 90d supply, fill #0

## 2024-09-11 MED ORDER — METOPROLOL SUCCINATE ER 25 MG PO TB24
12.5000 mg | ORAL_TABLET | Freq: Every day | ORAL | 0 refills | Status: AC
  Filled 2024-09-11: qty 45, 90d supply, fill #0

## 2024-09-12 ENCOUNTER — Other Ambulatory Visit: Payer: Self-pay
